# Patient Record
Sex: Male | Born: 1960 | Race: Black or African American | Hispanic: No | Marital: Married | Smoking: Current every day smoker
Health system: Southern US, Community
[De-identification: ages and names within clinical notes are randomized; demographics above are authoritative.]

## PROBLEM LIST (undated history)

## (undated) DIAGNOSIS — S0990XA Unspecified injury of head, initial encounter: Secondary | ICD-10-CM

## (undated) DIAGNOSIS — J449 Chronic obstructive pulmonary disease, unspecified: Secondary | ICD-10-CM

## (undated) DIAGNOSIS — K5732 Diverticulitis of large intestine without perforation or abscess without bleeding: Secondary | ICD-10-CM

## (undated) DIAGNOSIS — M479 Spondylosis, unspecified: Secondary | ICD-10-CM

## (undated) DIAGNOSIS — I10 Essential (primary) hypertension: Secondary | ICD-10-CM

## (undated) HISTORY — PX: APPENDECTOMY: SHX54

## (undated) HISTORY — PX: CHOLECYSTECTOMY: SHX55

## (undated) HISTORY — PX: COLOSTOMY TAKEDOWN: SHX5258

---

## 2006-12-01 ENCOUNTER — Ambulatory Visit: Payer: Self-pay | Admitting: Family Medicine

## 2006-12-05 ENCOUNTER — Ambulatory Visit: Payer: Self-pay | Admitting: *Deleted

## 2007-01-12 ENCOUNTER — Ambulatory Visit: Payer: Self-pay | Admitting: Family Medicine

## 2007-01-28 ENCOUNTER — Emergency Department (HOSPITAL_COMMUNITY): Admission: EM | Admit: 2007-01-28 | Discharge: 2007-01-28 | Payer: Self-pay | Admitting: Emergency Medicine

## 2007-01-29 ENCOUNTER — Encounter: Payer: Self-pay | Admitting: Vascular Surgery

## 2007-04-17 ENCOUNTER — Emergency Department (HOSPITAL_COMMUNITY): Admission: EM | Admit: 2007-04-17 | Discharge: 2007-04-17 | Payer: Self-pay | Admitting: Emergency Medicine

## 2007-06-24 ENCOUNTER — Encounter (INDEPENDENT_AMBULATORY_CARE_PROVIDER_SITE_OTHER): Payer: Self-pay | Admitting: *Deleted

## 2007-10-08 HISTORY — PX: COLOSTOMY: SHX63

## 2007-10-08 HISTORY — PX: LEFT COLECTOMY: SHX856

## 2007-10-08 HISTORY — PX: HERNIA REPAIR: SHX51

## 2007-11-02 ENCOUNTER — Ambulatory Visit: Payer: Self-pay | Admitting: Internal Medicine

## 2008-06-27 ENCOUNTER — Emergency Department (HOSPITAL_COMMUNITY): Admission: EM | Admit: 2008-06-27 | Discharge: 2008-06-27 | Payer: Self-pay | Admitting: Emergency Medicine

## 2009-02-26 ENCOUNTER — Emergency Department (HOSPITAL_COMMUNITY): Admission: EM | Admit: 2009-02-26 | Discharge: 2009-02-26 | Payer: Self-pay | Admitting: Emergency Medicine

## 2009-11-25 ENCOUNTER — Emergency Department (HOSPITAL_COMMUNITY): Admission: EM | Admit: 2009-11-25 | Discharge: 2009-11-25 | Payer: Self-pay | Admitting: Emergency Medicine

## 2010-09-24 ENCOUNTER — Emergency Department (HOSPITAL_COMMUNITY)
Admission: EM | Admit: 2010-09-24 | Discharge: 2010-09-24 | Payer: Self-pay | Source: Home / Self Care | Admitting: Emergency Medicine

## 2011-02-28 ENCOUNTER — Emergency Department (HOSPITAL_COMMUNITY)
Admission: EM | Admit: 2011-02-28 | Discharge: 2011-03-01 | Disposition: A | Discharge status: Home / Self Care | Payer: Medicaid Other | Attending: Emergency Medicine | Admitting: Emergency Medicine

## 2011-02-28 ENCOUNTER — Emergency Department (HOSPITAL_COMMUNITY): Payer: Medicaid Other

## 2011-02-28 DIAGNOSIS — R079 Chest pain, unspecified: Secondary | ICD-10-CM | POA: Insufficient documentation

## 2011-02-28 DIAGNOSIS — F341 Dysthymic disorder: Secondary | ICD-10-CM | POA: Insufficient documentation

## 2011-02-28 DIAGNOSIS — J449 Chronic obstructive pulmonary disease, unspecified: Secondary | ICD-10-CM | POA: Insufficient documentation

## 2011-02-28 DIAGNOSIS — R0989 Other specified symptoms and signs involving the circulatory and respiratory systems: Secondary | ICD-10-CM | POA: Insufficient documentation

## 2011-02-28 DIAGNOSIS — R059 Cough, unspecified: Secondary | ICD-10-CM | POA: Insufficient documentation

## 2011-02-28 DIAGNOSIS — R0609 Other forms of dyspnea: Secondary | ICD-10-CM | POA: Insufficient documentation

## 2011-02-28 DIAGNOSIS — R05 Cough: Secondary | ICD-10-CM | POA: Insufficient documentation

## 2011-02-28 DIAGNOSIS — J4489 Other specified chronic obstructive pulmonary disease: Secondary | ICD-10-CM | POA: Insufficient documentation

## 2011-02-28 DIAGNOSIS — I10 Essential (primary) hypertension: Secondary | ICD-10-CM | POA: Insufficient documentation

## 2011-03-01 ENCOUNTER — Emergency Department (HOSPITAL_COMMUNITY): Payer: Medicaid Other

## 2011-03-01 ENCOUNTER — Encounter (HOSPITAL_COMMUNITY): Payer: Self-pay

## 2011-03-01 LAB — DIFFERENTIAL
Basophils Relative: 0 % (ref 0–1)
Eosinophils Absolute: 0.1 10*3/uL (ref 0.0–0.7)
Lymphocytes Relative: 17 % (ref 12–46)
Lymphs Abs: 1.6 10*3/uL (ref 0.7–4.0)
Monocytes Absolute: 0.8 10*3/uL (ref 0.1–1.0)
Monocytes Relative: 9 % (ref 3–12)

## 2011-03-01 LAB — CBC
Hemoglobin: 17 g/dL (ref 13.0–17.0)
MCHC: 34.6 g/dL (ref 30.0–36.0)
RDW: 13 % (ref 11.5–15.5)

## 2011-03-01 LAB — POCT I-STAT, CHEM 8
BUN: 12 mg/dL (ref 6–23)
Calcium, Ion: 1.1 mmol/L — ABNORMAL LOW (ref 1.12–1.32)
Chloride: 106 mEq/L (ref 96–112)
Creatinine, Ser: 0.9 mg/dL (ref 0.4–1.5)
HCT: 53 % — ABNORMAL HIGH (ref 39.0–52.0)
Hemoglobin: 18 g/dL — ABNORMAL HIGH (ref 13.0–17.0)
Potassium: 3.9 mEq/L (ref 3.5–5.1)
TCO2: 21 mmol/L (ref 0–100)

## 2011-03-01 LAB — CK TOTAL AND CKMB (NOT AT ARMC): CK, MB: 1.2 ng/mL (ref 0.3–4.0)

## 2011-03-01 LAB — TROPONIN I: Troponin I: <0.3 ng/mL (ref <0.30)

## 2011-03-01 MED ORDER — IOHEXOL 300 MG/ML  SOLN
100.0000 mL | Freq: Once | INTRAMUSCULAR | Status: AC | PRN
  Administered 2011-03-01: 100 mL via INTRAVENOUS

## 2011-05-31 ENCOUNTER — Emergency Department (HOSPITAL_COMMUNITY): Payer: Medicaid Other

## 2011-05-31 ENCOUNTER — Emergency Department (HOSPITAL_COMMUNITY)
Admission: EM | Admit: 2011-05-31 | Discharge: 2011-05-31 | Disposition: A | Discharge status: Home / Self Care | Payer: Medicaid Other | Attending: Emergency Medicine | Admitting: Emergency Medicine

## 2011-05-31 DIAGNOSIS — M79609 Pain in unspecified limb: Secondary | ICD-10-CM | POA: Insufficient documentation

## 2011-05-31 DIAGNOSIS — M542 Cervicalgia: Secondary | ICD-10-CM | POA: Insufficient documentation

## 2011-05-31 DIAGNOSIS — M25519 Pain in unspecified shoulder: Secondary | ICD-10-CM | POA: Insufficient documentation

## 2011-05-31 DIAGNOSIS — F341 Dysthymic disorder: Secondary | ICD-10-CM | POA: Insufficient documentation

## 2011-05-31 DIAGNOSIS — I10 Essential (primary) hypertension: Secondary | ICD-10-CM | POA: Insufficient documentation

## 2011-05-31 DIAGNOSIS — M62838 Other muscle spasm: Secondary | ICD-10-CM | POA: Insufficient documentation

## 2012-01-25 ENCOUNTER — Emergency Department (HOSPITAL_COMMUNITY)
Admission: EM | Admit: 2012-01-25 | Discharge: 2012-01-25 | Disposition: A | Discharge status: Home / Self Care | Payer: Medicaid Other | Attending: Emergency Medicine | Admitting: Emergency Medicine

## 2012-01-25 ENCOUNTER — Encounter (HOSPITAL_COMMUNITY): Payer: Self-pay | Admitting: *Deleted

## 2012-01-25 DIAGNOSIS — Z933 Colostomy status: Secondary | ICD-10-CM | POA: Insufficient documentation

## 2012-01-25 DIAGNOSIS — M5430 Sciatica, unspecified side: Secondary | ICD-10-CM | POA: Insufficient documentation

## 2012-01-25 DIAGNOSIS — W010XXA Fall on same level from slipping, tripping and stumbling without subsequent striking against object, initial encounter: Secondary | ICD-10-CM | POA: Insufficient documentation

## 2012-01-25 DIAGNOSIS — M479 Spondylosis, unspecified: Secondary | ICD-10-CM | POA: Insufficient documentation

## 2012-01-25 DIAGNOSIS — F17200 Nicotine dependence, unspecified, uncomplicated: Secondary | ICD-10-CM | POA: Insufficient documentation

## 2012-01-25 HISTORY — DX: Spondylosis, unspecified: M47.9

## 2012-01-25 MED ORDER — KETOROLAC TROMETHAMINE 60 MG/2ML IM SOLN
60.0000 mg | Freq: Once | INTRAMUSCULAR | Status: AC
  Administered 2012-01-25: 60 mg via INTRAMUSCULAR
  Filled 2012-01-25: qty 2

## 2012-01-25 MED ORDER — CYCLOBENZAPRINE HCL 10 MG PO TABS: 10.0000 mg | ORAL_TABLET | Freq: Three times a day (TID) | ORAL | Status: AC | PRN

## 2012-01-25 MED ORDER — PREDNISONE 50 MG PO TABS: 50.0000 mg | ORAL_TABLET | Freq: Every day | ORAL | Status: AC

## 2012-01-25 MED ORDER — OXYCODONE-ACETAMINOPHEN 5-325 MG PO TABS: 1.0000 | ORAL_TABLET | Freq: Four times a day (QID) | ORAL | Status: AC | PRN

## 2012-01-25 MED ORDER — HYDROCODONE-ACETAMINOPHEN 5-325 MG PO TABS: 1.0000 | ORAL_TABLET | Freq: Four times a day (QID) | ORAL | Status: DC | PRN

## 2012-01-25 NOTE — ED Notes (Addendum)
Pt c/o lower back pain that began yesterday. Describes pain as throbbing. States he was diagnosed with scoliosis and degenerative arthritis in lower spine 1 year ago

## 2012-01-25 NOTE — Discharge Instructions (Signed)
Ice and heat to your lower back. Return here as needed. Follow up with your doctor for a recheck.
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
## 2012-01-27 NOTE — ED Provider Notes (Signed)
Medical screening examination/treatment/procedure(s) were performed by non-physician practitioner and as supervising physician I was immediately available for consultation/collaboration.    Vernon Ariel L Naseem Varden, MD 01/27/12 1104 

## 2012-04-04 ENCOUNTER — Emergency Department (HOSPITAL_COMMUNITY): Payer: No Typology Code available for payment source

## 2012-04-04 ENCOUNTER — Encounter (HOSPITAL_COMMUNITY): Payer: Self-pay | Admitting: *Deleted

## 2012-04-04 ENCOUNTER — Emergency Department (HOSPITAL_COMMUNITY)
Admission: EM | Admit: 2012-04-04 | Discharge: 2012-04-05 | Disposition: A | Discharge status: Home / Self Care | Payer: No Typology Code available for payment source | Attending: Emergency Medicine | Admitting: Emergency Medicine

## 2012-04-04 DIAGNOSIS — T1490XA Injury, unspecified, initial encounter: Secondary | ICD-10-CM | POA: Insufficient documentation

## 2012-04-04 DIAGNOSIS — I10 Essential (primary) hypertension: Secondary | ICD-10-CM | POA: Insufficient documentation

## 2012-04-04 DIAGNOSIS — M542 Cervicalgia: Secondary | ICD-10-CM | POA: Insufficient documentation

## 2012-04-04 DIAGNOSIS — R51 Headache: Secondary | ICD-10-CM | POA: Insufficient documentation

## 2012-04-04 DIAGNOSIS — S0180XA Unspecified open wound of other part of head, initial encounter: Secondary | ICD-10-CM | POA: Insufficient documentation

## 2012-04-04 DIAGNOSIS — R079 Chest pain, unspecified: Secondary | ICD-10-CM | POA: Insufficient documentation

## 2012-04-04 DIAGNOSIS — R0602 Shortness of breath: Secondary | ICD-10-CM | POA: Insufficient documentation

## 2012-04-04 HISTORY — DX: Essential (primary) hypertension: I10

## 2012-04-04 MED ORDER — FENTANYL CITRATE 0.05 MG/ML IJ SOLN
50.0000 ug | Freq: Once | INTRAMUSCULAR | Status: AC
  Administered 2012-04-04: 50 ug via INTRAVENOUS
  Filled 2012-04-04: qty 2

## 2012-04-04 MED ORDER — TETANUS-DIPHTH-ACELL PERTUSSIS 5-2.5-18.5 LF-MCG/0.5 IM SUSP
0.5000 mL | Freq: Once | INTRAMUSCULAR | Status: AC
  Administered 2012-04-04: 0.5 mL via INTRAMUSCULAR
  Filled 2012-04-04: qty 0.5
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
## 2012-04-05 MED ORDER — HYDROCODONE-ACETAMINOPHEN 5-500 MG PO TABS: 1.0000 | ORAL_TABLET | Freq: Four times a day (QID) | ORAL | Status: DC | PRN

## 2012-04-05 MED ORDER — HYDROCODONE-ACETAMINOPHEN 5-325 MG PO TABS
1.0000 | ORAL_TABLET | Freq: Once | ORAL | Status: AC
  Administered 2012-04-05: 1 via ORAL
  Filled 2012-04-05: qty 1

## 2012-04-05 MED ORDER — HYDROMORPHONE HCL PF 1 MG/ML IJ SOLN
1.0000 mg | Freq: Once | INTRAMUSCULAR | Status: AC
  Administered 2012-04-05: 1 mg via INTRAVENOUS

## 2012-04-05 MED ORDER — HYDROMORPHONE HCL PF 1 MG/ML IJ SOLN
INTRAMUSCULAR | Status: AC
  Filled 2012-04-05: qty 1
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
## 2012-04-05 NOTE — ED Notes (Signed)
Wounds cleaned and dressed.

## 2012-04-09 ENCOUNTER — Encounter (HOSPITAL_COMMUNITY): Payer: Self-pay | Admitting: Emergency Medicine

## 2012-04-09 ENCOUNTER — Emergency Department (HOSPITAL_COMMUNITY)
Admission: EM | Admit: 2012-04-09 | Discharge: 2012-04-09 | Disposition: A | Discharge status: Home / Self Care | Payer: No Typology Code available for payment source | Attending: Emergency Medicine | Admitting: Emergency Medicine

## 2012-04-09 DIAGNOSIS — F17200 Nicotine dependence, unspecified, uncomplicated: Secondary | ICD-10-CM | POA: Insufficient documentation

## 2012-04-09 DIAGNOSIS — IMO0002 Reserved for concepts with insufficient information to code with codable children: Secondary | ICD-10-CM

## 2012-04-09 DIAGNOSIS — I10 Essential (primary) hypertension: Secondary | ICD-10-CM | POA: Insufficient documentation

## 2012-04-09 DIAGNOSIS — IMO0001 Reserved for inherently not codable concepts without codable children: Secondary | ICD-10-CM

## 2012-04-09 DIAGNOSIS — Z4802 Encounter for removal of sutures: Secondary | ICD-10-CM | POA: Insufficient documentation

## 2012-04-09 MED ORDER — HYDROCODONE-ACETAMINOPHEN 5-500 MG PO TABS: 1.0000 | ORAL_TABLET | Freq: Four times a day (QID) | ORAL | Status: AC | PRN

## 2012-04-09 MED ORDER — HYDROCODONE-ACETAMINOPHEN 5-325 MG PO TABS
2.0000 | ORAL_TABLET | Freq: Once | ORAL | Status: AC
  Administered 2012-04-09: 2 via ORAL
  Filled 2012-04-09: qty 2
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
## 2012-04-09 NOTE — ED Notes (Signed)
Pt has multiple abrasions on extremeties and face. Laceration over l/eye. Sutures in place

## 2012-04-16 ENCOUNTER — Encounter (HOSPITAL_BASED_OUTPATIENT_CLINIC_OR_DEPARTMENT_OTHER): Payer: No Typology Code available for payment source | Attending: Internal Medicine

## 2012-04-16 DIAGNOSIS — I10 Essential (primary) hypertension: Secondary | ICD-10-CM | POA: Insufficient documentation

## 2012-04-16 DIAGNOSIS — S81009A Unspecified open wound, unspecified knee, initial encounter: Secondary | ICD-10-CM | POA: Insufficient documentation

## 2012-04-16 DIAGNOSIS — S81809A Unspecified open wound, unspecified lower leg, initial encounter: Secondary | ICD-10-CM | POA: Insufficient documentation

## 2012-04-16 DIAGNOSIS — M1990 Unspecified osteoarthritis, unspecified site: Secondary | ICD-10-CM | POA: Insufficient documentation

## 2012-04-16 DIAGNOSIS — S61509A Unspecified open wound of unspecified wrist, initial encounter: Secondary | ICD-10-CM | POA: Insufficient documentation
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
## 2012-04-23 ENCOUNTER — Emergency Department (HOSPITAL_COMMUNITY)
Admission: EM | Admit: 2012-04-23 | Discharge: 2012-04-23 | Disposition: A | Discharge status: Home / Self Care | Payer: No Typology Code available for payment source | Attending: Emergency Medicine | Admitting: Emergency Medicine

## 2012-04-23 ENCOUNTER — Encounter (HOSPITAL_COMMUNITY): Payer: Self-pay | Admitting: Emergency Medicine

## 2012-04-23 DIAGNOSIS — Z833 Family history of diabetes mellitus: Secondary | ICD-10-CM | POA: Insufficient documentation

## 2012-04-23 DIAGNOSIS — S060X0A Concussion without loss of consciousness, initial encounter: Secondary | ICD-10-CM | POA: Insufficient documentation

## 2012-04-23 DIAGNOSIS — F17200 Nicotine dependence, unspecified, uncomplicated: Secondary | ICD-10-CM | POA: Insufficient documentation

## 2012-04-23 DIAGNOSIS — Z760 Encounter for issue of repeat prescription: Secondary | ICD-10-CM

## 2012-04-23 DIAGNOSIS — M479 Spondylosis, unspecified: Secondary | ICD-10-CM | POA: Insufficient documentation

## 2012-04-23 DIAGNOSIS — Z9089 Acquired absence of other organs: Secondary | ICD-10-CM | POA: Insufficient documentation

## 2012-04-23 DIAGNOSIS — S060X9A Concussion with loss of consciousness of unspecified duration, initial encounter: Secondary | ICD-10-CM

## 2012-04-23 DIAGNOSIS — I10 Essential (primary) hypertension: Secondary | ICD-10-CM | POA: Insufficient documentation

## 2012-04-23 MED ORDER — IBUPROFEN 200 MG PO TABS
600.0000 mg | ORAL_TABLET | Freq: Once | ORAL | Status: AC
  Administered 2012-04-23: 600 mg via ORAL
  Filled 2012-04-23: qty 3

## 2012-04-23 MED ORDER — OXYCODONE-ACETAMINOPHEN 5-325 MG PO TABS
2.0000 | ORAL_TABLET | Freq: Once | ORAL | Status: AC
  Administered 2012-04-23: 2 via ORAL
  Filled 2012-04-23: qty 2

## 2012-04-23 MED ORDER — HYDROCODONE-ACETAMINOPHEN 5-325 MG PO TABS: 1.0000 | ORAL_TABLET | Freq: Four times a day (QID) | ORAL | Status: AC | PRN

## 2012-04-23 MED ORDER — ALPRAZOLAM 1 MG PO TABS: 1.0000 mg | ORAL_TABLET | Freq: Three times a day (TID) | ORAL | Status: DC | PRN
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
## 2012-05-14 ENCOUNTER — Encounter (HOSPITAL_BASED_OUTPATIENT_CLINIC_OR_DEPARTMENT_OTHER): Payer: No Typology Code available for payment source | Attending: Internal Medicine

## 2012-05-14 DIAGNOSIS — S81009A Unspecified open wound, unspecified knee, initial encounter: Secondary | ICD-10-CM | POA: Insufficient documentation

## 2012-05-14 DIAGNOSIS — S91009A Unspecified open wound, unspecified ankle, initial encounter: Secondary | ICD-10-CM | POA: Insufficient documentation

## 2012-05-14 DIAGNOSIS — I10 Essential (primary) hypertension: Secondary | ICD-10-CM | POA: Insufficient documentation

## 2012-05-14 DIAGNOSIS — M1990 Unspecified osteoarthritis, unspecified site: Secondary | ICD-10-CM | POA: Insufficient documentation

## 2012-05-14 DIAGNOSIS — S61509A Unspecified open wound of unspecified wrist, initial encounter: Secondary | ICD-10-CM | POA: Insufficient documentation

## 2012-05-28 ENCOUNTER — Emergency Department (HOSPITAL_COMMUNITY)
Admission: EM | Admit: 2012-05-28 | Discharge: 2012-05-29 | Disposition: A | Discharge status: Home / Self Care | Payer: Medicaid Other | Attending: Emergency Medicine | Admitting: Emergency Medicine

## 2012-05-28 ENCOUNTER — Encounter (HOSPITAL_COMMUNITY): Payer: Self-pay | Admitting: Emergency Medicine

## 2012-05-28 DIAGNOSIS — Z79899 Other long term (current) drug therapy: Secondary | ICD-10-CM | POA: Insufficient documentation

## 2012-05-28 DIAGNOSIS — S30860A Insect bite (nonvenomous) of lower back and pelvis, initial encounter: Secondary | ICD-10-CM | POA: Insufficient documentation

## 2012-05-28 DIAGNOSIS — W57XXXA Bitten or stung by nonvenomous insect and other nonvenomous arthropods, initial encounter: Secondary | ICD-10-CM | POA: Insufficient documentation

## 2012-05-28 DIAGNOSIS — L0390 Cellulitis, unspecified: Secondary | ICD-10-CM

## 2012-05-28 DIAGNOSIS — G43909 Migraine, unspecified, not intractable, without status migrainosus: Secondary | ICD-10-CM | POA: Insufficient documentation

## 2012-05-28 DIAGNOSIS — L02219 Cutaneous abscess of trunk, unspecified: Secondary | ICD-10-CM | POA: Insufficient documentation

## 2012-05-28 DIAGNOSIS — I10 Essential (primary) hypertension: Secondary | ICD-10-CM | POA: Insufficient documentation

## 2012-05-28 HISTORY — DX: Unspecified injury of head, initial encounter: S09.90XA

## 2012-05-28 MED ORDER — DEXAMETHASONE SODIUM PHOSPHATE 10 MG/ML IJ SOLN
10.0000 mg | Freq: Once | INTRAMUSCULAR | Status: AC
  Administered 2012-05-29: 10 mg via INTRAMUSCULAR
  Filled 2012-05-28: qty 1

## 2012-05-28 MED ORDER — ONDANSETRON 4 MG PO TBDP
4.0000 mg | ORAL_TABLET | Freq: Once | ORAL | Status: AC
  Administered 2012-05-28: 4 mg via ORAL
  Filled 2012-05-28: qty 1

## 2012-05-28 MED ORDER — SULFAMETHOXAZOLE-TMP DS 800-160 MG PO TABS
1.0000 | ORAL_TABLET | Freq: Once | ORAL | Status: AC
  Administered 2012-05-28: 1 via ORAL
  Filled 2012-05-28: qty 1

## 2012-05-28 MED ORDER — CIPROFLOXACIN HCL 500 MG PO TABS: 500.0000 mg | ORAL_TABLET | Freq: Once | ORAL | Status: DC

## 2012-05-28 MED ORDER — CEPHALEXIN 250 MG PO CAPS
250.0000 mg | ORAL_CAPSULE | Freq: Once | ORAL | Status: AC
  Administered 2012-05-29: 250 mg via ORAL
  Filled 2012-05-28: qty 1

## 2012-05-28 MED ORDER — KETOROLAC TROMETHAMINE 60 MG/2ML IM SOLN
60.0000 mg | Freq: Once | INTRAMUSCULAR | Status: AC
  Administered 2012-05-29: 60 mg via INTRAMUSCULAR
  Filled 2012-05-28: qty 2

## 2012-05-28 MED ORDER — HYDROMORPHONE HCL PF 1 MG/ML IJ SOLN
1.0000 mg | Freq: Once | INTRAMUSCULAR | Status: AC
  Administered 2012-05-29: 1 mg via INTRAMUSCULAR
  Filled 2012-05-28: qty 1
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
## 2012-05-28 NOTE — ED Notes (Signed)
Pt states he is here mainly for his migraine headache  Pt states has hx of same   Pt states earlier today he was stung by a bee on his left side  Pt has a large welp noted with redness  Pt states he has had some dizziness today too but is not sure if it is from the headache or the sting

## 2012-05-29 MED ORDER — OXYCODONE-ACETAMINOPHEN 5-325 MG PO TABS: 1.0000 | ORAL_TABLET | Freq: Four times a day (QID) | ORAL | Status: AC | PRN

## 2012-05-29 MED ORDER — ONDANSETRON HCL 4 MG PO TABS: 4.0000 mg | ORAL_TABLET | Freq: Four times a day (QID) | ORAL | Status: AC

## 2012-05-29 MED ORDER — SULFAMETHOXAZOLE-TRIMETHOPRIM 800-160 MG PO TABS: 1.0000 | ORAL_TABLET | Freq: Two times a day (BID) | ORAL | Status: AC

## 2012-05-29 MED ORDER — CEPHALEXIN 500 MG PO CAPS: 500.0000 mg | ORAL_CAPSULE | Freq: Four times a day (QID) | ORAL | Status: AC

## 2012-05-29 MED ORDER — OXYCODONE-ACETAMINOPHEN 5-325 MG PO TABS
2.0000 | ORAL_TABLET | Freq: Once | ORAL | Status: AC
  Administered 2012-05-29: 2 via ORAL
  Filled 2012-05-29: qty 2

## 2012-05-30 NOTE — ED Provider Notes (Signed)
Medical screening examination/treatment/procedure(s) were performed by non-physician practitioner and as supervising physician I was immediately available for consultation/collaboration.   Charleton Deyoung, MD 05/30/12 2304 

## 2012-08-03 ENCOUNTER — Emergency Department (HOSPITAL_COMMUNITY)
Admission: EM | Admit: 2012-08-03 | Discharge: 2012-08-03 | Disposition: A | Discharge status: Home / Self Care | Payer: Medicaid Other | Attending: Dermatology | Admitting: Dermatology

## 2012-08-03 ENCOUNTER — Encounter (HOSPITAL_COMMUNITY): Payer: Self-pay | Admitting: *Deleted

## 2012-08-03 ENCOUNTER — Emergency Department (HOSPITAL_COMMUNITY): Payer: Medicaid Other

## 2012-08-03 DIAGNOSIS — Z87828 Personal history of other (healed) physical injury and trauma: Secondary | ICD-10-CM | POA: Insufficient documentation

## 2012-08-03 DIAGNOSIS — R0609 Other forms of dyspnea: Secondary | ICD-10-CM | POA: Insufficient documentation

## 2012-08-03 DIAGNOSIS — R0989 Other specified symptoms and signs involving the circulatory and respiratory systems: Secondary | ICD-10-CM | POA: Insufficient documentation

## 2012-08-03 DIAGNOSIS — R51 Headache: Secondary | ICD-10-CM | POA: Insufficient documentation

## 2012-08-03 DIAGNOSIS — F17200 Nicotine dependence, unspecified, uncomplicated: Secondary | ICD-10-CM | POA: Insufficient documentation

## 2012-08-03 DIAGNOSIS — R0600 Dyspnea, unspecified: Secondary | ICD-10-CM

## 2012-08-03 DIAGNOSIS — Z79899 Other long term (current) drug therapy: Secondary | ICD-10-CM | POA: Insufficient documentation

## 2012-08-03 DIAGNOSIS — M479 Spondylosis, unspecified: Secondary | ICD-10-CM | POA: Insufficient documentation

## 2012-08-03 DIAGNOSIS — I10 Essential (primary) hypertension: Secondary | ICD-10-CM | POA: Insufficient documentation

## 2012-08-03 HISTORY — DX: Chronic obstructive pulmonary disease, unspecified: J44.9

## 2012-08-03 LAB — CBC
HCT: 51 % (ref 39.0–52.0)
Hemoglobin: 18.4 g/dL — ABNORMAL HIGH (ref 13.0–17.0)
MCV: 91.1 fL (ref 78.0–100.0)
RDW: 13.8 % (ref 11.5–15.5)
WBC: 4.3 10*3/uL (ref 4.0–10.5)

## 2012-08-03 LAB — BASIC METABOLIC PANEL
BUN: 11 mg/dL (ref 6–23)
Chloride: 103 mEq/L (ref 96–112)
Creatinine, Ser: 0.81 mg/dL (ref 0.50–1.35)
Glucose, Bld: 105 mg/dL — ABNORMAL HIGH (ref 70–99)
Potassium: 4.2 mEq/L (ref 3.5–5.1)

## 2012-08-03 MED ORDER — HYDROCODONE-ACETAMINOPHEN 5-325 MG PO TABS
2.0000 | ORAL_TABLET | Freq: Once | ORAL | Status: AC
  Administered 2012-08-03: 2 via ORAL
  Filled 2012-08-03: qty 2

## 2012-08-03 MED ORDER — HYDROCODONE-ACETAMINOPHEN 5-500 MG PO TABS: 1.0000 | ORAL_TABLET | Freq: Four times a day (QID) | ORAL | Status: DC | PRN

## 2012-08-03 MED ORDER — PROMETHAZINE HCL 25 MG/ML IJ SOLN
25.0000 mg | Freq: Once | INTRAMUSCULAR | Status: AC
  Administered 2012-08-03: 25 mg via INTRAMUSCULAR
  Filled 2012-08-03: qty 1

## 2012-08-03 MED ORDER — ALBUTEROL SULFATE HFA 108 (90 BASE) MCG/ACT IN AERS: 2.0000 | INHALATION_SPRAY | RESPIRATORY_TRACT | Status: DC | PRN

## 2012-08-03 MED ORDER — ALBUTEROL SULFATE (5 MG/ML) 0.5% IN NEBU
5.0000 mg | INHALATION_SOLUTION | Freq: Once | RESPIRATORY_TRACT | Status: AC
  Administered 2012-08-03: 5 mg via RESPIRATORY_TRACT
  Filled 2012-08-03: qty 1

## 2012-08-03 NOTE — ED Notes (Signed)
Off floor for testing 

## 2012-08-03 NOTE — ED Notes (Addendum)
Pt's family came out of the room, stating "he's getting ready to leave.  He's been here for 6 hours now and nothing is done."  She started to walk away from the room.  Pt's other family/friend came out of the room stating pt is needing to use the BR.  Went in the room, pt standing beside the stretcher with gown on the stretcher.  Pt states that he's been waiting for a long time.  He was given pain med at 1400 and it has helped for "a little bit".  Pt ambulated to the BR, pt made aware that while he is using the BR that this nurse will talk with EDP who was f/u with him re pain med and what his dispo is.  [facility] ED[facility] notified.  Went back in room to speak to pt.  Pt asked "what are these anyways" pt pointing at the cardiac monitor and the leads.  Asked pt if he was c/o chest pain.  Pt states "yes."  Explained to pt the reason for the cardiac monitor and also the reason for more lab being drawn at certain times for comparison.  At this time Stacy primary nurse came in the room.  Pt also made aware that [facility] EDP put in an order for phernergan for his h/a, which is coming from the pharmacy.

## 2012-08-03 NOTE — ED Notes (Signed)
RT called

## 2012-08-03 NOTE — ED Notes (Signed)
Per patient/family member, patient has a history of COPD

## 2012-08-03 NOTE — ED Notes (Addendum)
Pt reports being sent to radiology for chest xray for shob by PCP. Decided to come to ED due to migraines he has been having. Is being followed by a neurologist. Sts he is taking topamax but it is making him "sick" and the office has not called him back. Pt reports migraine pain radiates down neck and back, and dizziness. Also wants to be seen for the shob, none noted currently, pt in NAD. Able to easily speak in complete sentences.
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
## 2013-03-29 ENCOUNTER — Emergency Department (HOSPITAL_COMMUNITY)
Admission: EM | Admit: 2013-03-29 | Discharge: 2013-03-29 | Disposition: A | Discharge status: Home / Self Care | Payer: Self-pay | Attending: Emergency Medicine | Admitting: Emergency Medicine

## 2013-03-29 ENCOUNTER — Encounter (HOSPITAL_COMMUNITY): Payer: Self-pay | Admitting: Emergency Medicine

## 2013-03-29 ENCOUNTER — Emergency Department (HOSPITAL_COMMUNITY): Payer: Self-pay

## 2013-03-29 DIAGNOSIS — M479 Spondylosis, unspecified: Secondary | ICD-10-CM | POA: Insufficient documentation

## 2013-03-29 DIAGNOSIS — J4489 Other specified chronic obstructive pulmonary disease: Secondary | ICD-10-CM | POA: Insufficient documentation

## 2013-03-29 DIAGNOSIS — I10 Essential (primary) hypertension: Secondary | ICD-10-CM | POA: Insufficient documentation

## 2013-03-29 DIAGNOSIS — J449 Chronic obstructive pulmonary disease, unspecified: Secondary | ICD-10-CM | POA: Insufficient documentation

## 2013-03-29 DIAGNOSIS — M25512 Pain in left shoulder: Secondary | ICD-10-CM

## 2013-03-29 DIAGNOSIS — K047 Periapical abscess without sinus: Secondary | ICD-10-CM

## 2013-03-29 DIAGNOSIS — Z9119 Patient's noncompliance with other medical treatment and regimen: Secondary | ICD-10-CM | POA: Insufficient documentation

## 2013-03-29 DIAGNOSIS — M25519 Pain in unspecified shoulder: Secondary | ICD-10-CM | POA: Insufficient documentation

## 2013-03-29 DIAGNOSIS — Z79899 Other long term (current) drug therapy: Secondary | ICD-10-CM | POA: Insufficient documentation

## 2013-03-29 DIAGNOSIS — F17200 Nicotine dependence, unspecified, uncomplicated: Secondary | ICD-10-CM | POA: Insufficient documentation

## 2013-03-29 DIAGNOSIS — Z91199 Patient's noncompliance with other medical treatment and regimen due to unspecified reason: Secondary | ICD-10-CM | POA: Insufficient documentation

## 2013-03-29 MED ORDER — HYDROCHLOROTHIAZIDE 12.5 MG PO CAPS
12.5000 mg | ORAL_CAPSULE | Freq: Once | ORAL | Status: AC
  Administered 2013-03-29: 12.5 mg via ORAL
  Filled 2013-03-29: qty 1

## 2013-03-29 MED ORDER — LISINOPRIL 20 MG PO TABS
20.0000 mg | ORAL_TABLET | Freq: Every day | ORAL | Status: DC
  Administered 2013-03-29: 20 mg via ORAL
  Filled 2013-03-29: qty 1

## 2013-03-29 MED ORDER — OXYCODONE-ACETAMINOPHEN 5-325 MG PO TABS
2.0000 | ORAL_TABLET | Freq: Once | ORAL | Status: AC
  Administered 2013-03-29: 2 via ORAL
  Filled 2013-03-29: qty 2

## 2013-03-29 MED ORDER — TOPIRAMATE 50 MG PO TABS: 50.0000 mg | ORAL_TABLET | Freq: Two times a day (BID) | ORAL | Status: DC

## 2013-03-29 MED ORDER — ALBUTEROL SULFATE HFA 108 (90 BASE) MCG/ACT IN AERS: 2.0000 | INHALATION_SPRAY | RESPIRATORY_TRACT | Status: AC | PRN

## 2013-03-29 MED ORDER — DEXAMETHASONE SODIUM PHOSPHATE 10 MG/ML IJ SOLN
10.0000 mg | Freq: Once | INTRAMUSCULAR | Status: AC
  Administered 2013-03-29: 10 mg via INTRAMUSCULAR
  Filled 2013-03-29: qty 1

## 2013-03-29 MED ORDER — LISINOPRIL-HYDROCHLOROTHIAZIDE 20-12.5 MG PO TABS: 1.0000 | ORAL_TABLET | Freq: Every day | ORAL | Status: DC

## 2013-03-29 MED ORDER — OXYCODONE-ACETAMINOPHEN 10-325 MG PO TABS: 1.0000 | ORAL_TABLET | ORAL | Status: DC | PRN

## 2013-03-29 NOTE — ED Notes (Signed)
Pt reports having hx of HTN-ran out of his meds since Friday last week.

## 2013-03-29 NOTE — ED Notes (Signed)
Pt presenting to ed with c/o sore throat pain x 1 1/2 days and left shoulder pain pt denies injury at this time

## 2013-03-29 NOTE — ED Provider Notes (Signed)
4:10 PM I saw the patient earlier and did not discharge him with an antibiotic for dental infection. I was able to contact the patient and called in a prescription for amoxicillin 500 mg bid x 7 days at [facility].  Arthor Captain, PA-C 03/29/13 1611
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
## 2013-03-29 NOTE — Progress Notes (Signed)
[facility] has seen patient. Pt stated that he has medicaid, but something was wrong with card at Registration. Pt stated he was going to call about it when he leaves the hospital. Pt stated he was pending disability. Provided pt with oc application and a list of primary care resources.

## 2013-03-30 NOTE — ED Provider Notes (Signed)
Medical screening examination/treatment/procedure(s) were performed by non-physician practitioner and as supervising physician I was immediately available for consultation/collaboration.  Mahala Rommel M Blyss Lugar, MD 03/30/13 0710 

## 2013-03-30 NOTE — ED Provider Notes (Signed)
Medical screening examination/treatment/procedure(s) were performed by non-physician practitioner and as supervising physician I was immediately available for consultation/collaboration.  Lyanne Co, MD 03/30/13 [phone]

## 2013-03-31 LAB — CULTURE, GROUP A STREP

## 2013-07-23 ENCOUNTER — Emergency Department (HOSPITAL_COMMUNITY): Payer: Medicaid Other

## 2013-07-23 ENCOUNTER — Emergency Department (HOSPITAL_COMMUNITY)
Admission: EM | Admit: 2013-07-23 | Discharge: 2013-07-23 | Disposition: A | Discharge status: Home / Self Care | Payer: Medicaid Other | Attending: Emergency Medicine | Admitting: Emergency Medicine

## 2013-07-23 ENCOUNTER — Encounter (HOSPITAL_COMMUNITY): Payer: Self-pay | Admitting: Emergency Medicine

## 2013-07-23 DIAGNOSIS — F10929 Alcohol use, unspecified with intoxication, unspecified: Secondary | ICD-10-CM

## 2013-07-23 DIAGNOSIS — Z79899 Other long term (current) drug therapy: Secondary | ICD-10-CM | POA: Insufficient documentation

## 2013-07-23 DIAGNOSIS — Z8739 Personal history of other diseases of the musculoskeletal system and connective tissue: Secondary | ICD-10-CM | POA: Insufficient documentation

## 2013-07-23 DIAGNOSIS — J449 Chronic obstructive pulmonary disease, unspecified: Secondary | ICD-10-CM | POA: Insufficient documentation

## 2013-07-23 DIAGNOSIS — J4489 Other specified chronic obstructive pulmonary disease: Secondary | ICD-10-CM | POA: Insufficient documentation

## 2013-07-23 DIAGNOSIS — F1010 Alcohol abuse, uncomplicated: Secondary | ICD-10-CM | POA: Insufficient documentation

## 2013-07-23 DIAGNOSIS — Z87820 Personal history of traumatic brain injury: Secondary | ICD-10-CM | POA: Insufficient documentation

## 2013-07-23 DIAGNOSIS — I10 Essential (primary) hypertension: Secondary | ICD-10-CM | POA: Insufficient documentation

## 2013-07-23 DIAGNOSIS — R5381 Other malaise: Secondary | ICD-10-CM | POA: Insufficient documentation

## 2013-07-23 DIAGNOSIS — F17200 Nicotine dependence, unspecified, uncomplicated: Secondary | ICD-10-CM | POA: Insufficient documentation

## 2013-07-23 LAB — CBC WITH DIFFERENTIAL/PLATELET
Basophils Relative: 0 % (ref 0–1)
HCT: 53.3 % — ABNORMAL HIGH (ref 39.0–52.0)
Hemoglobin: 19.2 g/dL — ABNORMAL HIGH (ref 13.0–17.0)
Lymphocytes Relative: 42 % (ref 12–46)
MCHC: 36 g/dL (ref 30.0–36.0)
Monocytes Absolute: 1 10*3/uL (ref 0.1–1.0)
Monocytes Relative: 17 % — ABNORMAL HIGH (ref 3–12)
Neutro Abs: 2.4 10*3/uL (ref 1.7–7.7)
Neutrophils Relative %: 41 % — ABNORMAL LOW (ref 43–77)
RBC: 5.75 MIL/uL (ref 4.22–5.81)
WBC: 5.7 10*3/uL (ref 4.0–10.5)

## 2013-07-23 LAB — COMPREHENSIVE METABOLIC PANEL
AST: 60 U/L — ABNORMAL HIGH (ref 0–37)
Albumin: 3.7 g/dL (ref 3.5–5.2)
Alkaline Phosphatase: 77 U/L (ref 39–117)
BUN: 8 mg/dL (ref 6–23)
CO2: 20 mEq/L (ref 19–32)
Chloride: 101 mEq/L (ref 96–112)
Creatinine, Ser: 0.75 mg/dL (ref 0.50–1.35)
GFR calc non Af Amer: >90 mL/min (ref >90)
Potassium: 3.1 mEq/L — ABNORMAL LOW (ref 3.5–5.1)
Total Bilirubin: 0.5 mg/dL (ref 0.3–1.2)

## 2013-07-23 LAB — ETHANOL: Alcohol, Ethyl (B): 216 mg/dL — ABNORMAL HIGH (ref 0–11)

## 2013-07-23 MED ORDER — THIAMINE HCL 100 MG/ML IJ SOLN
100.0000 mg | Freq: Once | INTRAMUSCULAR | Status: AC
  Administered 2013-07-23: 100 mg via INTRAVENOUS
  Filled 2013-07-23: qty 2

## 2013-07-23 MED ORDER — SODIUM CHLORIDE 0.9 % IV SOLN
1000.0000 mL | Freq: Once | INTRAVENOUS | Status: AC
  Administered 2013-07-23: 1000 mL via INTRAVENOUS

## 2013-07-23 MED ORDER — SODIUM CHLORIDE 0.9 % IV SOLN: 1000.0000 mL | INTRAVENOUS | Status: DC
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
## 2013-07-23 NOTE — ED Notes (Signed)
Pt has 2 knives locked up with security in locker 28.
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
## 2014-03-16 ENCOUNTER — Emergency Department (HOSPITAL_COMMUNITY): Payer: Self-pay

## 2014-03-16 ENCOUNTER — Emergency Department (HOSPITAL_COMMUNITY): Payer: Medicaid Other

## 2014-03-16 ENCOUNTER — Encounter (HOSPITAL_COMMUNITY): Payer: Self-pay | Admitting: Emergency Medicine

## 2014-03-16 ENCOUNTER — Emergency Department (HOSPITAL_COMMUNITY)
Admission: EM | Admit: 2014-03-16 | Discharge: 2014-03-16 | Disposition: A | Discharge status: Home / Self Care | Payer: Self-pay | Attending: Emergency Medicine | Admitting: Emergency Medicine

## 2014-03-16 DIAGNOSIS — J4489 Other specified chronic obstructive pulmonary disease: Secondary | ICD-10-CM | POA: Insufficient documentation

## 2014-03-16 DIAGNOSIS — R05 Cough: Secondary | ICD-10-CM | POA: Insufficient documentation

## 2014-03-16 DIAGNOSIS — J449 Chronic obstructive pulmonary disease, unspecified: Secondary | ICD-10-CM | POA: Insufficient documentation

## 2014-03-16 DIAGNOSIS — I10 Essential (primary) hypertension: Secondary | ICD-10-CM | POA: Insufficient documentation

## 2014-03-16 DIAGNOSIS — Z87828 Personal history of other (healed) physical injury and trauma: Secondary | ICD-10-CM | POA: Insufficient documentation

## 2014-03-16 DIAGNOSIS — R059 Cough, unspecified: Secondary | ICD-10-CM | POA: Insufficient documentation

## 2014-03-16 DIAGNOSIS — R109 Unspecified abdominal pain: Secondary | ICD-10-CM

## 2014-03-16 DIAGNOSIS — F17200 Nicotine dependence, unspecified, uncomplicated: Secondary | ICD-10-CM | POA: Insufficient documentation

## 2014-03-16 DIAGNOSIS — K439 Ventral hernia without obstruction or gangrene: Secondary | ICD-10-CM | POA: Insufficient documentation

## 2014-03-16 DIAGNOSIS — R079 Chest pain, unspecified: Secondary | ICD-10-CM | POA: Insufficient documentation

## 2014-03-16 DIAGNOSIS — Z9089 Acquired absence of other organs: Secondary | ICD-10-CM | POA: Insufficient documentation

## 2014-03-16 DIAGNOSIS — Z79899 Other long term (current) drug therapy: Secondary | ICD-10-CM | POA: Insufficient documentation

## 2014-03-16 DIAGNOSIS — Z8739 Personal history of other diseases of the musculoskeletal system and connective tissue: Secondary | ICD-10-CM | POA: Insufficient documentation

## 2014-03-16 LAB — COMPREHENSIVE METABOLIC PANEL
ALBUMIN: 4.2 g/dL (ref 3.5–5.2)
ALT: 70 U/L — ABNORMAL HIGH (ref 0–53)
AST: 34 U/L (ref 0–37)
Alkaline Phosphatase: 72 U/L (ref 39–117)
BILIRUBIN TOTAL: 0.8 mg/dL (ref 0.3–1.2)
BUN: 12 mg/dL (ref 6–23)
CALCIUM: 9.7 mg/dL (ref 8.4–10.5)
CO2: 26 mEq/L (ref 19–32)
CREATININE: 0.86 mg/dL (ref 0.50–1.35)
Chloride: 96 mEq/L (ref 96–112)
GFR calc Af Amer: >90 mL/min (ref >90)
GFR calc non Af Amer: >90 mL/min (ref >90)
Glucose, Bld: 120 mg/dL — ABNORMAL HIGH (ref 70–99)
Potassium: 3.4 mEq/L — ABNORMAL LOW (ref 3.7–5.3)
Sodium: 137 mEq/L (ref 137–147)
Total Protein: 7.8 g/dL (ref 6.0–8.3)

## 2014-03-16 LAB — URINALYSIS, ROUTINE W REFLEX MICROSCOPIC
BILIRUBIN URINE: NEGATIVE
Glucose, UA: NEGATIVE mg/dL
Hgb urine dipstick: NEGATIVE
Ketones, ur: 15 mg/dL — AB
Leukocytes, UA: NEGATIVE
NITRITE: NEGATIVE
Protein, ur: NEGATIVE mg/dL
SPECIFIC GRAVITY, URINE: 1.019 (ref 1.005–1.030)
UROBILINOGEN UA: 1 mg/dL (ref 0.0–1.0)
pH: 6 (ref 5.0–8.0)

## 2014-03-16 LAB — ETHANOL: Alcohol, Ethyl (B): <11 mg/dL (ref 0–11)

## 2014-03-16 LAB — CBC
HEMATOCRIT: 51.3 % (ref 39.0–52.0)
Hemoglobin: 18.7 g/dL — ABNORMAL HIGH (ref 13.0–17.0)
MCH: 33.2 pg (ref 26.0–34.0)
MCHC: 36.5 g/dL — AB (ref 30.0–36.0)
MCV: 91 fL (ref 78.0–100.0)
Platelets: 177 10*3/uL (ref 150–400)
RBC: 5.64 MIL/uL (ref 4.22–5.81)
RDW: 12.6 % (ref 11.5–15.5)
WBC: 5.3 10*3/uL (ref 4.0–10.5)

## 2014-03-16 LAB — RAPID URINE DRUG SCREEN, HOSP PERFORMED
Amphetamines: NOT DETECTED
Barbiturates: NOT DETECTED
Benzodiazepines: NOT DETECTED
Cocaine: NOT DETECTED
Opiates: NOT DETECTED
TETRAHYDROCANNABINOL: POSITIVE — AB

## 2014-03-16 LAB — TROPONIN I

## 2014-03-16 LAB — LIPASE, BLOOD: LIPASE: 20 U/L (ref 11–59)

## 2014-03-16 MED ORDER — HYDROMORPHONE HCL PF 1 MG/ML IJ SOLN
1.0000 mg | Freq: Once | INTRAMUSCULAR | Status: AC
  Administered 2014-03-16: 1 mg via INTRAVENOUS
  Filled 2014-03-16: qty 1

## 2014-03-16 MED ORDER — TRAMADOL HCL 50 MG PO TABS: 50.0000 mg | ORAL_TABLET | Freq: Four times a day (QID) | ORAL | Status: DC | PRN

## 2014-03-16 MED ORDER — PANTOPRAZOLE SODIUM 40 MG PO TBEC: 40.0000 mg | DELAYED_RELEASE_TABLET | Freq: Every day | ORAL | Status: DC

## 2014-03-16 MED ORDER — ONDANSETRON HCL 4 MG/2ML IJ SOLN
4.0000 mg | Freq: Once | INTRAMUSCULAR | Status: AC
  Administered 2014-03-16: 4 mg via INTRAVENOUS
  Filled 2014-03-16: qty 2

## 2014-03-16 MED ORDER — IOHEXOL 300 MG/ML  SOLN
50.0000 mL | Freq: Once | INTRAMUSCULAR | Status: AC | PRN
  Administered 2014-03-16: 50 mL via ORAL

## 2014-03-16 MED ORDER — ONDANSETRON HCL 8 MG PO TABS
8.0000 mg | ORAL_TABLET | Freq: Three times a day (TID) | ORAL | Status: DC | PRN
Start: 2014-03-16 — End: 2014-04-29

## 2014-03-16 MED ORDER — SODIUM CHLORIDE 0.9 % IV BOLUS (SEPSIS)
1000.0000 mL | Freq: Once | INTRAVENOUS | Status: AC
  Administered 2014-03-16: 1000 mL via INTRAVENOUS

## 2014-03-16 MED ORDER — IOHEXOL 300 MG/ML  SOLN
100.0000 mL | Freq: Once | INTRAMUSCULAR | Status: AC | PRN
  Administered 2014-03-16: 100 mL via INTRAVENOUS
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
## 2014-03-16 NOTE — Progress Notes (Signed)
[facility] provided pt with a list of primary care resources, highlighting [facility], list of shelters, hot meals, food pantries, etc. Explained to patient about [employer].

## 2014-03-16 NOTE — ED Notes (Signed)
Pt reports abdominal pain/ issues x6 years. In 2009 had colostomy and hernia repair. Reports he has "pig mesh" in his stomach. Pt reports for last 2 years he wakes up every morning and dry heaves and stomach hurts, pt does not actually vomit. Pain 10/10. Pt reports he is homeless and had not been to see a doctor about this before.

## 2014-03-16 NOTE — Discharge Instructions (Signed)
Rest. Drink plenty of fluids.  Take protonix (acid blocker).  You may also take ultram as need for pain - no driving when taking.  You may take zofran as need for nausea.  If reflux symptoms, you may also try taking pepcid and maalox as need for symptom relief.  You ct scan shows a ventral/abdominal hernia. Follow up with general surgeon in the next couple weeks - see referral - call office to arrange appointment.  Your potassium level is slightly low today (3.4) - eat plenty of fruits and vegetables, and follow up with primary care doctor for recheck in 1 week.  Your blood pressure is high, follow up with primary care doctor in coming week.   Return to ER if worse, new symptoms, fevers, persistent vomiting, worsening or severe abdominal pain, other concern.  You were given pain medication in the ER - no driving for the next 4 hours.     Abdominal Pain, Adult Many things can cause abdominal pain. Usually, abdominal pain is not caused by a disease and will improve without treatment. It can often be observed and treated at home. Your health care provider will do a physical exam and possibly order blood tests and X-rays to help determine the seriousness of your pain. However, in many cases, more time must pass before a clear cause of the pain can be found. Before that point, your health care provider may not know if you need more testing or further treatment. HOME CARE INSTRUCTIONS  Monitor your abdominal pain for any changes. The following actions may help to alleviate any discomfort you are experiencing:  Only take over-the-counter or prescription medicines as directed by your health care provider.  Do not take laxatives unless directed to do so by your health care provider.  Try a clear liquid diet (broth, tea, or water) as directed by your health care provider. Slowly move to a bland diet as tolerated. SEEK MEDICAL CARE IF:  You have unexplained abdominal pain.  You have abdominal  pain associated with nausea or diarrhea.  You have pain when you urinate or have a bowel movement.  You experience abdominal pain that wakes you in the night.  You have abdominal pain that is worsened or improved by eating food.  You have abdominal pain that is worsened with eating fatty foods. SEEK IMMEDIATE MEDICAL CARE IF:   Your pain does not go away within 2 hours.  You have a fever.  You keep throwing up (vomiting).  Your pain is felt only in portions of the abdomen, such as the right side or the left lower portion of the abdomen.  You pass bloody or black tarry stools. MAKE SURE YOU:  Understand these instructions.   Will watch your condition.   Will get help right away if you are not doing well or get worse.  Document Released: 07/03/2005 Document Revised: 07/14/2013 Document Reviewed: 06/02/2013 [facility] Patient Information 2014 [place], [place].    Hernia A hernia occurs when an internal organ pushes out through a weak spot in the abdominal wall. Hernias most commonly occur in the groin and around the navel. Hernias often can be pushed back into place (reduced). Most hernias tend to get worse over time. Some abdominal hernias can get stuck in the opening (irreducible or incarcerated hernia) and cannot be reduced. An irreducible abdominal hernia which is tightly squeezed into the opening is at risk for impaired blood supply (strangulated hernia). A strangulated hernia is a medical emergency. Because of the risk  for an irreducible or strangulated hernia, surgery may be recommended to repair a hernia. CAUSES   Heavy lifting.  Prolonged coughing.  Straining to have a bowel movement.  A cut (incision) made during an abdominal surgery. HOME CARE INSTRUCTIONS   Bed rest is not required. You may continue your normal activities.  Avoid lifting more than 10 pounds (4.5 kg) or straining.  Cough gently. If you are a smoker it is best to stop. Even the best hernia  repair can break down with the continual strain of coughing. Even if you do not have your hernia repaired, a cough will continue to aggravate the problem.  Do not wear anything tight over your hernia. Do not try to keep it in with an outside bandage or truss. These can damage abdominal contents if they are trapped within the hernia sac.  Eat a normal diet.  Avoid constipation. Straining over long periods of time will increase hernia size and encourage breakdown of repairs. If you cannot do this with diet alone, stool softeners may be used. SEEK IMMEDIATE MEDICAL CARE IF:   You have a fever.  You develop increasing abdominal pain.  You feel nauseous or vomit.  Your hernia is stuck outside the abdomen, looks discolored, feels hard, or is tender.  You have any changes in your bowel habits or in the hernia that are unusual for you.  You have increased pain or swelling around the hernia.  You cannot push the hernia back in place by applying gentle pressure while lying down. MAKE SURE YOU:   Understand these instructions.  Will watch your condition.  Will get help right away if you are not doing well or get worse. Document Released: 09/23/2005 Document Revised: 12/16/2011 Document Reviewed: 05/12/2008 [facility] Patient Information 2014 [place], [place].   Hypokalemia Hypokalemia means that the amount of potassium in the blood is lower than normal.Potassium is a chemical, called an electrolyte, that helps regulate the amount of fluid in the body. It also stimulates muscle contraction and helps nerves function properly.Most of the body's potassium is inside of cells, and only a very small amount is in the blood. Because the amount in the blood is so small, minor changes can be life-threatening. CAUSES  Antibiotics.  Diarrhea or vomiting.  Using laxatives too much, which can cause diarrhea.  Chronic kidney disease.  Water pills (diuretics).  Eating disorders (bulimia).  Low  magnesium level.  Sweating a lot. SIGNS AND SYMPTOMS  Weakness.  Constipation.  Fatigue.  Muscle cramps.  Mental confusion.  Skipped heartbeats or irregular heartbeat (palpitations).  Tingling or numbness. DIAGNOSIS  Your health care provider can diagnose hypokalemia with blood tests. In addition to checking your potassium level, your health care provider may also check other lab tests. TREATMENT Hypokalemia can be treated with potassium supplements taken by mouth or adjustments in your current medicines. If your potassium level is very low, you may need to get potassium through a vein (IV) and be monitored in the hospital. A diet high in potassium is also helpful. Foods high in potassium are:  Nuts, such as peanuts and pistachios.  Seeds, such as sunflower seeds and pumpkin seeds.  Peas, lentils, and lima beans.  Whole grain and bran cereals and breads.  Fresh fruit and vegetables, such as apricots, avocado, bananas, cantaloupe, kiwi, oranges, tomatoes, asparagus, and potatoes.  Orange and tomato juices.  Red meats.  Fruit yogurt. HOME CARE INSTRUCTIONS  Take all medicines as prescribed by your health care provider.  Maintain  a healthy diet by including nutritious food, such as fruits, vegetables, nuts, whole grains, and lean meats.  If you are taking a laxative, be sure to follow the directions on the label. SEEK MEDICAL CARE IF:  Your weakness gets worse.  You feel your heart pounding or racing.  You are vomiting or having diarrhea.  You are diabetic and having trouble keeping your blood glucose in the normal range. SEEK IMMEDIATE MEDICAL CARE IF:  You have chest pain, shortness of breath, or dizziness.  You are vomiting or having diarrhea for more than 2 days.  You faint. MAKE SURE YOU:   Understand these instructions.  Will watch your condition.  Will get help right away if you are not doing well or get worse. Document Released: 09/23/2005  Document Revised: 07/14/2013 Document Reviewed: 03/26/2013 [facility] Patient Information 2014 [place], [place].    Hypertension As your heart beats, it forces blood through your arteries. This force is your blood pressure. If the pressure is too high, it is called hypertension (HTN) or high blood pressure. HTN is dangerous because you may have it and not know it. High blood pressure may mean that your heart has to work harder to pump blood. Your arteries may be narrow or stiff. The extra work puts you at risk for heart disease, stroke, and other problems.  Blood pressure consists of two numbers, a higher number over a lower, 110/72, for example. It is stated as "110 over 72." The ideal is below 120 for the top number (systolic) and under 80 for the bottom (diastolic). Write down your blood pressure today. You should pay close attention to your blood pressure if you have certain conditions such as:  Heart failure.  Prior heart attack.  Diabetes  Chronic kidney disease.  Prior stroke.  Multiple risk factors for heart disease. To see if you have HTN, your blood pressure should be measured while you are seated with your arm held at the level of the heart. It should be measured at least twice. A one-time elevated blood pressure reading (especially in the Emergency Department) does not mean that you need treatment. There may be conditions in which the blood pressure is different between your right and left arms. It is important to see your caregiver soon for a recheck. Most people have essential hypertension which means that there is not a specific cause. This type of high blood pressure may be lowered by changing lifestyle factors such as:  Stress.  Smoking.  Lack of exercise.  Excessive weight.  Drug/tobacco/alcohol use.  Eating less salt. Most people do not have symptoms from high blood pressure until it has caused damage to the body. Effective treatment can often prevent, delay or  reduce that damage. TREATMENT  When a cause has been identified, treatment for high blood pressure is directed at the cause. There are a large number of medications to treat HTN. These fall into several categories, and your caregiver will help you select the medicines that are best for you. Medications may have side effects. You should review side effects with your caregiver. If your blood pressure stays high after you have made lifestyle changes or started on medicines,   Your medication(s) may need to be changed.  Other problems may need to be addressed.  Be certain you understand your prescriptions, and know how and when to take your medicine.  Be sure to follow up with your caregiver within the time frame advised (usually within two weeks) to have your blood  pressure rechecked and to review your medications.  If you are taking more than one medicine to lower your blood pressure, make sure you know how and at what times they should be taken. Taking two medicines at the same time can result in blood pressure that is too low. SEEK IMMEDIATE MEDICAL CARE IF:  You develop a severe headache, blurred or changing vision, or confusion.  You have unusual weakness or numbness, or a faint feeling.  You have severe chest or abdominal pain, vomiting, or breathing problems. MAKE SURE YOU:   Understand these instructions.  Will watch your condition.  Will get help right away if you are not doing well or get worse. Document Released: 09/23/2005 Document Revised: 12/16/2011 Document Reviewed: 05/13/2008 [facility] Patient Information 2014 [place], [place].   Gastritis, Adult Gastritis is soreness and swelling (inflammation) of the lining of the stomach. Gastritis can develop as a sudden onset (acute) or long-term (chronic) condition. If gastritis is not treated, it can lead to stomach bleeding and ulcers. CAUSES  Gastritis occurs when the stomach lining is weak or damaged. Digestive juices from the  stomach then inflame the weakened stomach lining. The stomach lining may be weak or damaged due to viral or bacterial infections. One common bacterial infection is the Helicobacter pylori infection. Gastritis can also result from excessive alcohol consumption, taking certain medicines, or having too much acid in the stomach.  SYMPTOMS  In some cases, there are no symptoms. When symptoms are present, they may include:  Pain or a burning sensation in the upper abdomen.  Nausea.  Vomiting.  An uncomfortable feeling of fullness after eating. DIAGNOSIS  Your caregiver may suspect you have gastritis based on your symptoms and a physical exam. To determine the cause of your gastritis, your caregiver may perform the following:  Blood or stool tests to check for the H pylori bacterium.  Gastroscopy. A thin, flexible tube (endoscope) is passed down the esophagus and into the stomach. The endoscope has a light and camera on the end. Your caregiver uses the endoscope to view the inside of the stomach.  Taking a tissue sample (biopsy) from the stomach to examine under a microscope. TREATMENT  Depending on the cause of your gastritis, medicines may be prescribed. If you have a bacterial infection, such as an H pylori infection, antibiotics may be given. If your gastritis is caused by too much acid in the stomach, H2 blockers or antacids may be given. Your caregiver may recommend that you stop taking aspirin, ibuprofen, or other nonsteroidal anti-inflammatory drugs (NSAIDs). HOME CARE INSTRUCTIONS  Only take over-the-counter or prescription medicines as directed by your caregiver.  If you were given antibiotic medicines, take them as directed. Finish them even if you start to feel better.  Drink enough fluids to keep your urine clear or pale yellow.  Avoid foods and drinks that make your symptoms worse, such as:  Caffeine or alcoholic drinks.  Chocolate.  Peppermint or mint flavorings.  Garlic  and onions.  Spicy foods.  Citrus fruits, such as oranges, lemons, or limes.  Tomato-based foods such as sauce, chili, salsa, and pizza.  Fried and fatty foods.  Eat small, frequent meals instead of large meals. SEEK IMMEDIATE MEDICAL CARE IF:   You have black or dark red stools.  You vomit blood or material that looks like coffee grounds.  You are unable to keep fluids down.  Your abdominal pain gets worse.  You have a fever.  You do not feel better after  1 week.  You have any other questions or concerns. MAKE SURE YOU:  Understand these instructions.  Will watch your condition.  Will get help right away if you are not doing well or get worse. Document Released: 09/17/2001 Document Revised: 03/24/2012 Document Reviewed: 11/06/2011 [facility] Patient Information 2014 [place].   Gastroesophageal Reflux Disease, Adult Gastroesophageal reflux disease (GERD) happens when acid from your stomach flows up into the esophagus. When acid comes in contact with the esophagus, the acid causes soreness (inflammation) in the esophagus. Over time, GERD may create small holes (ulcers) in the lining of the esophagus. CAUSES   Increased body weight. This puts pressure on the stomach, making acid rise from the stomach into the esophagus.  Smoking. This increases acid production in the stomach.  Drinking alcohol. This causes decreased pressure in the lower esophageal sphincter (valve or ring of muscle between the esophagus and stomach), allowing acid from the stomach into the esophagus.  Late evening meals and a full stomach. This increases pressure and acid production in the stomach.  A malformed lower esophageal sphincter. Sometimes, no cause is found. SYMPTOMS   Burning pain in the lower part of the mid-chest behind the breastbone and in the mid-stomach area. This may occur twice a week or more often.  Trouble swallowing.  Sore throat.  Dry cough.  Asthma-like  symptoms including chest tightness, shortness of breath, or wheezing. DIAGNOSIS  Your caregiver may be able to diagnose GERD based on your symptoms. In some cases, X-rays and other tests may be done to check for complications or to check the condition of your stomach and esophagus. TREATMENT  Your caregiver may recommend over-the-counter or prescription medicines to help decrease acid production. Ask your caregiver before starting or adding any new medicines.  HOME CARE INSTRUCTIONS   Change the factors that you can control. Ask your caregiver for guidance concerning weight loss, quitting smoking, and alcohol consumption.  Avoid foods and drinks that make your symptoms worse, such as:  Caffeine or alcoholic drinks.  Chocolate.  Peppermint or mint flavorings.  Garlic and onions.  Spicy foods.  Citrus fruits, such as oranges, lemons, or limes.  Tomato-based foods such as sauce, chili, salsa, and pizza.  Fried and fatty foods.  Avoid lying down for the 3 hours prior to your bedtime or prior to taking a nap.  Eat small, frequent meals instead of large meals.  Wear loose-fitting clothing. Do not wear anything tight around your waist that causes pressure on your stomach.  Raise the head of your bed 6 to 8 inches with wood blocks to help you sleep. Extra pillows will not help.  Only take over-the-counter or prescription medicines for pain, discomfort, or fever as directed by your caregiver.  Do not take aspirin, ibuprofen, or other nonsteroidal anti-inflammatory drugs (NSAIDs). SEEK IMMEDIATE MEDICAL CARE IF:   You have pain in your arms, neck, jaw, teeth, or back.  Your pain increases or changes in intensity or duration.  You develop nausea, vomiting, or sweating (diaphoresis).  You develop shortness of breath, or you faint.  Your vomit is green, yellow, black, or looks like coffee grounds or blood.  Your stool is red, bloody, or black. These symptoms could be signs of  other problems, such as heart disease, gastric bleeding, or esophageal bleeding. MAKE SURE YOU:   Understand these instructions.  Will watch your condition.  Will get help right away if you are not doing well or get worse. Document Released: 07/03/2005 Document Revised:  12/16/2011 Document Reviewed: 04/12/2011 [employer] Patient Information 2014 [place], [place].

## 2014-03-16 NOTE — ED Notes (Signed)
Pt aware that a urine sample is needed but sts he is currently unable to go.  Will recheck with pt within the hour.

## 2014-04-29 ENCOUNTER — Encounter (HOSPITAL_COMMUNITY): Payer: Self-pay | Admitting: Emergency Medicine

## 2014-04-29 ENCOUNTER — Emergency Department (HOSPITAL_COMMUNITY)
Admission: EM | Admit: 2014-04-29 | Discharge: 2014-04-29 | Disposition: A | Discharge status: Home / Self Care | Payer: No Typology Code available for payment source | Attending: Emergency Medicine | Admitting: Emergency Medicine

## 2014-04-29 DIAGNOSIS — Y9389 Activity, other specified: Secondary | ICD-10-CM | POA: Insufficient documentation

## 2014-04-29 DIAGNOSIS — M545 Low back pain, unspecified: Secondary | ICD-10-CM

## 2014-04-29 DIAGNOSIS — J4489 Other specified chronic obstructive pulmonary disease: Secondary | ICD-10-CM | POA: Insufficient documentation

## 2014-04-29 DIAGNOSIS — J449 Chronic obstructive pulmonary disease, unspecified: Secondary | ICD-10-CM | POA: Insufficient documentation

## 2014-04-29 DIAGNOSIS — I10 Essential (primary) hypertension: Secondary | ICD-10-CM | POA: Insufficient documentation

## 2014-04-29 DIAGNOSIS — F17200 Nicotine dependence, unspecified, uncomplicated: Secondary | ICD-10-CM | POA: Insufficient documentation

## 2014-04-29 DIAGNOSIS — IMO0002 Reserved for concepts with insufficient information to code with codable children: Secondary | ICD-10-CM | POA: Insufficient documentation

## 2014-04-29 DIAGNOSIS — Z8739 Personal history of other diseases of the musculoskeletal system and connective tissue: Secondary | ICD-10-CM | POA: Insufficient documentation

## 2014-04-29 DIAGNOSIS — Z79899 Other long term (current) drug therapy: Secondary | ICD-10-CM | POA: Insufficient documentation

## 2014-04-29 DIAGNOSIS — Y92410 Unspecified street and highway as the place of occurrence of the external cause: Secondary | ICD-10-CM | POA: Insufficient documentation

## 2014-04-29 MED ORDER — TRAMADOL HCL 50 MG PO TABS: 50.0000 mg | ORAL_TABLET | Freq: Four times a day (QID) | ORAL | Status: DC | PRN

## 2014-04-29 NOTE — Discharge Instructions (Signed)
Take Tramadol as needed for pain. Refer to attached documents for more information. Follow up with your doctor as needed.  °
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
## 2014-05-02 NOTE — ED Provider Notes (Signed)
History/physical exam/procedure(s) were performed by non-physician practitioner and as supervising physician I was immediately available for consultation/collaboration. I have reviewed all notes and am in agreement with care and plan.   Hilario Quarry, MD 05/02/14 [phone]

## 2014-05-07 ENCOUNTER — Inpatient Hospital Stay (HOSPITAL_COMMUNITY)
Admission: EM | Admit: 2014-05-07 | Discharge: 2014-05-10 | DRG: 395 | Disposition: A | Discharge status: Home / Self Care | Payer: Medicaid Other | Attending: General Surgery | Admitting: General Surgery

## 2014-05-07 ENCOUNTER — Encounter (HOSPITAL_COMMUNITY): Payer: Self-pay | Admitting: Emergency Medicine

## 2014-05-07 ENCOUNTER — Emergency Department (HOSPITAL_COMMUNITY): Payer: Medicaid Other

## 2014-05-07 DIAGNOSIS — K5289 Other specified noninfective gastroenteritis and colitis: Secondary | ICD-10-CM

## 2014-05-07 DIAGNOSIS — F411 Generalized anxiety disorder: Secondary | ICD-10-CM | POA: Diagnosis present

## 2014-05-07 DIAGNOSIS — Z9049 Acquired absence of other specified parts of digestive tract: Secondary | ICD-10-CM

## 2014-05-07 DIAGNOSIS — F419 Anxiety disorder, unspecified: Secondary | ICD-10-CM

## 2014-05-07 DIAGNOSIS — K631 Perforation of intestine (nontraumatic): Principal | ICD-10-CM | POA: Diagnosis present

## 2014-05-07 DIAGNOSIS — Z720 Tobacco use: Secondary | ICD-10-CM

## 2014-05-07 DIAGNOSIS — J449 Chronic obstructive pulmonary disease, unspecified: Secondary | ICD-10-CM | POA: Diagnosis present

## 2014-05-07 DIAGNOSIS — M479 Spondylosis, unspecified: Secondary | ICD-10-CM | POA: Diagnosis present

## 2014-05-07 DIAGNOSIS — K219 Gastro-esophageal reflux disease without esophagitis: Secondary | ICD-10-CM

## 2014-05-07 DIAGNOSIS — Z833 Family history of diabetes mellitus: Secondary | ICD-10-CM

## 2014-05-07 DIAGNOSIS — J4489 Other specified chronic obstructive pulmonary disease: Secondary | ICD-10-CM | POA: Diagnosis present

## 2014-05-07 DIAGNOSIS — Z791 Long term (current) use of non-steroidal anti-inflammatories (NSAID): Secondary | ICD-10-CM

## 2014-05-07 DIAGNOSIS — Z79899 Other long term (current) drug therapy: Secondary | ICD-10-CM

## 2014-05-07 DIAGNOSIS — R1032 Left lower quadrant pain: Secondary | ICD-10-CM

## 2014-05-07 DIAGNOSIS — Z9089 Acquired absence of other organs: Secondary | ICD-10-CM

## 2014-05-07 DIAGNOSIS — F17200 Nicotine dependence, unspecified, uncomplicated: Secondary | ICD-10-CM | POA: Diagnosis present

## 2014-05-07 DIAGNOSIS — I10 Essential (primary) hypertension: Secondary | ICD-10-CM | POA: Diagnosis present

## 2014-05-07 DIAGNOSIS — F410 Panic disorder [episodic paroxysmal anxiety] without agoraphobia: Secondary | ICD-10-CM | POA: Diagnosis present

## 2014-05-07 HISTORY — DX: Diverticulitis of large intestine without perforation or abscess without bleeding: K57.32

## 2014-05-07 LAB — URINALYSIS, ROUTINE W REFLEX MICROSCOPIC
Bilirubin Urine: NEGATIVE
Glucose, UA: NEGATIVE mg/dL
Hgb urine dipstick: NEGATIVE
KETONES UR: NEGATIVE mg/dL
LEUKOCYTES UA: NEGATIVE
NITRITE: NEGATIVE
PH: 7 (ref 5.0–8.0)
Protein, ur: NEGATIVE mg/dL
Specific Gravity, Urine: 1.013 (ref 1.005–1.030)
Urobilinogen, UA: 1 mg/dL (ref 0.0–1.0)

## 2014-05-07 LAB — COMPREHENSIVE METABOLIC PANEL
ALT: 33 U/L (ref 0–53)
AST: 22 U/L (ref 0–37)
Albumin: 3.8 g/dL (ref 3.5–5.2)
Alkaline Phosphatase: 68 U/L (ref 39–117)
Anion gap: 15 (ref 5–15)
BUN: 11 mg/dL (ref 6–23)
CO2: 24 meq/L (ref 19–32)
CREATININE: 0.75 mg/dL (ref 0.50–1.35)
Calcium: 9.5 mg/dL (ref 8.4–10.5)
Chloride: 96 mEq/L (ref 96–112)
GFR calc Af Amer: >90 mL/min (ref >90)
Glucose, Bld: 137 mg/dL — ABNORMAL HIGH (ref 70–99)
Potassium: 3.6 mEq/L — ABNORMAL LOW (ref 3.7–5.3)
Sodium: 135 mEq/L — ABNORMAL LOW (ref 137–147)
TOTAL PROTEIN: 7.8 g/dL (ref 6.0–8.3)
Total Bilirubin: 0.7 mg/dL (ref 0.3–1.2)

## 2014-05-07 LAB — CBC WITH DIFFERENTIAL/PLATELET
BASOS ABS: 0 10*3/uL (ref 0.0–0.1)
Basophils Relative: 0 % (ref 0–1)
EOS PCT: 0 % (ref 0–5)
Eosinophils Absolute: 0 10*3/uL (ref 0.0–0.7)
HCT: 48.2 % (ref 39.0–52.0)
Hemoglobin: 16.8 g/dL (ref 13.0–17.0)
Lymphocytes Relative: 11 % — ABNORMAL LOW (ref 12–46)
Lymphs Abs: 0.9 10*3/uL (ref 0.7–4.0)
MCH: 32.1 pg (ref 26.0–34.0)
MCHC: 34.9 g/dL (ref 30.0–36.0)
MCV: 92.2 fL (ref 78.0–100.0)
Monocytes Absolute: 1 10*3/uL (ref 0.1–1.0)
Monocytes Relative: 12 % (ref 3–12)
NEUTROS PCT: 77 % (ref 43–77)
Neutro Abs: 6.8 10*3/uL (ref 1.7–7.7)
Platelets: 173 10*3/uL (ref 150–400)
RBC: 5.23 MIL/uL (ref 4.22–5.81)
RDW: 13 % (ref 11.5–15.5)
WBC: 8.8 10*3/uL (ref 4.0–10.5)

## 2014-05-07 LAB — LIPASE, BLOOD: LIPASE: 14 U/L (ref 11–59)

## 2014-05-07 MED ORDER — LEVALBUTEROL HCL 0.63 MG/3ML IN NEBU
0.6300 mg | INHALATION_SOLUTION | Freq: Four times a day (QID) | RESPIRATORY_TRACT | Status: DC | PRN
Start: 2014-05-07 — End: 2014-05-10

## 2014-05-07 MED ORDER — ACETAMINOPHEN 500 MG PO TABS
1000.0000 mg | ORAL_TABLET | Freq: Four times a day (QID) | ORAL | Status: DC | PRN
  Administered 2014-05-08 – 2014-05-09 (×4): 1000 mg via ORAL
  Filled 2014-05-07 (×4): qty 2

## 2014-05-07 MED ORDER — CIPROFLOXACIN IN D5W 400 MG/200ML IV SOLN
400.0000 mg | Freq: Two times a day (BID) | INTRAVENOUS | Status: DC
  Administered 2014-05-08 – 2014-05-10 (×5): 400 mg via INTRAVENOUS
  Filled 2014-05-07 (×7): qty 200

## 2014-05-07 MED ORDER — SODIUM CHLORIDE 0.9 % IV BOLUS (SEPSIS)
1000.0000 mL | INTRAVENOUS | Status: AC
  Administered 2014-05-07: 1000 mL via INTRAVENOUS

## 2014-05-07 MED ORDER — METRONIDAZOLE IN NACL 5-0.79 MG/ML-% IV SOLN
500.0000 mg | Freq: Three times a day (TID) | INTRAVENOUS | Status: DC
  Administered 2014-05-07 – 2014-05-10 (×9): 500 mg via INTRAVENOUS
  Filled 2014-05-07 (×10): qty 100

## 2014-05-07 MED ORDER — HYDROMORPHONE HCL PF 1 MG/ML IJ SOLN
1.0000 mg | INTRAMUSCULAR | Status: AC | PRN
  Administered 2014-05-07 – 2014-05-08 (×7): 1 mg via INTRAVENOUS
  Filled 2014-05-07 (×7): qty 1

## 2014-05-07 MED ORDER — LISINOPRIL-HYDROCHLOROTHIAZIDE 20-25 MG PO TABS: 1.0000 | ORAL_TABLET | Freq: Every day | ORAL | Status: DC

## 2014-05-07 MED ORDER — ONDANSETRON HCL 4 MG/2ML IJ SOLN: 4.0000 mg | Freq: Four times a day (QID) | INTRAMUSCULAR | Status: DC | PRN

## 2014-05-07 MED ORDER — ALUM & MAG HYDROXIDE-SIMETH 200-200-20 MG/5ML PO SUSP: 30.0000 mL | Freq: Four times a day (QID) | ORAL | Status: DC | PRN

## 2014-05-07 MED ORDER — METRONIDAZOLE IN NACL 5-0.79 MG/ML-% IV SOLN
500.0000 mg | Freq: Once | INTRAVENOUS | Status: AC
  Administered 2014-05-07: 500 mg via INTRAVENOUS
  Filled 2014-05-07: qty 100

## 2014-05-07 MED ORDER — SODIUM CHLORIDE 0.9 % IV SOLN
Freq: Once | INTRAVENOUS | Status: AC
  Administered 2014-05-07: 12:00:00 via INTRAVENOUS

## 2014-05-07 MED ORDER — HYDROMORPHONE HCL PF 1 MG/ML IJ SOLN
1.0000 mg | Freq: Once | INTRAMUSCULAR | Status: AC
  Administered 2014-05-07: 1 mg via INTRAVENOUS
  Filled 2014-05-07: qty 1

## 2014-05-07 MED ORDER — LISINOPRIL 20 MG PO TABS
20.0000 mg | ORAL_TABLET | Freq: Every day | ORAL | Status: DC
  Administered 2014-05-08 – 2014-05-10 (×3): 20 mg via ORAL
  Filled 2014-05-07 (×3): qty 1

## 2014-05-07 MED ORDER — HYDROMORPHONE HCL PF 1 MG/ML IJ SOLN
1.0000 mg | INTRAMUSCULAR | Status: AC
  Administered 2014-05-07: 1 mg via INTRAVENOUS
  Filled 2014-05-07: qty 1

## 2014-05-07 MED ORDER — HYDROCHLOROTHIAZIDE 25 MG PO TABS
25.0000 mg | ORAL_TABLET | Freq: Every day | ORAL | Status: DC
  Administered 2014-05-08 – 2014-05-10 (×3): 25 mg via ORAL
  Filled 2014-05-07 (×3): qty 1

## 2014-05-07 MED ORDER — AMLODIPINE BESYLATE 5 MG PO TABS
5.0000 mg | ORAL_TABLET | Freq: Every day | ORAL | Status: DC
  Administered 2014-05-08 – 2014-05-10 (×3): 5 mg via ORAL
  Filled 2014-05-07 (×3): qty 1

## 2014-05-07 MED ORDER — CIPROFLOXACIN IN D5W 400 MG/200ML IV SOLN
400.0000 mg | Freq: Once | INTRAVENOUS | Status: AC
  Administered 2014-05-07: 400 mg via INTRAVENOUS
  Filled 2014-05-07: qty 200

## 2014-05-07 MED ORDER — METRONIDAZOLE IN NACL 5-0.79 MG/ML-% IV SOLN
500.0000 mg | Freq: Three times a day (TID) | INTRAVENOUS | Status: DC
  Filled 2014-05-07: qty 100

## 2014-05-07 MED ORDER — ONDANSETRON HCL 4 MG/2ML IJ SOLN
4.0000 mg | Freq: Once | INTRAMUSCULAR | Status: AC
  Administered 2014-05-07: 4 mg via INTRAVENOUS
  Filled 2014-05-07: qty 2

## 2014-05-07 MED ORDER — HYDROMORPHONE HCL PF 1 MG/ML IJ SOLN
0.5000 mg | INTRAMUSCULAR | Status: DC | PRN
  Administered 2014-05-08: 0.5 mg via INTRAVENOUS
  Filled 2014-05-07 (×2): qty 1

## 2014-05-07 MED ORDER — CIPROFLOXACIN IN D5W 400 MG/200ML IV SOLN: 400.0000 mg | Freq: Two times a day (BID) | INTRAVENOUS | Status: DC

## 2014-05-07 MED ORDER — ENOXAPARIN SODIUM 40 MG/0.4ML ~~LOC~~ SOLN
40.0000 mg | SUBCUTANEOUS | Status: DC
  Administered 2014-05-07 – 2014-05-10 (×4): 40 mg via SUBCUTANEOUS
  Filled 2014-05-07 (×4): qty 0.4

## 2014-05-07 MED ORDER — SODIUM CHLORIDE 0.9 % IV SOLN: INTRAVENOUS | Status: AC

## 2014-05-07 MED ORDER — ONDANSETRON HCL 4 MG PO TABS: 4.0000 mg | ORAL_TABLET | Freq: Four times a day (QID) | ORAL | Status: DC | PRN

## 2014-05-07 MED ORDER — SODIUM CHLORIDE 0.9 % IV SOLN
INTRAVENOUS | Status: DC
  Administered 2014-05-07 – 2014-05-10 (×5): via INTRAVENOUS

## 2014-05-07 MED ORDER — NICOTINE 14 MG/24HR TD PT24
14.0000 mg | MEDICATED_PATCH | Freq: Every day | TRANSDERMAL | Status: DC
  Administered 2014-05-07 – 2014-05-10 (×4): 14 mg via TRANSDERMAL
  Filled 2014-05-07 (×4): qty 1

## 2014-05-07 MED ORDER — ALBUTEROL SULFATE (2.5 MG/3ML) 0.083% IN NEBU: 2.5000 mg | INHALATION_SOLUTION | RESPIRATORY_TRACT | Status: DC | PRN
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
## 2014-05-07 NOTE — ED Notes (Signed)
Patient will try to urinate once fluids are ran a little bit and urinal is placed at bed side.

## 2014-05-07 NOTE — ED Notes (Signed)
Pt provided with urinal to give sample
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
## 2014-05-07 NOTE — ED Notes (Signed)
Patient still is unable to urinate, will try again soon.

## 2014-05-08 ENCOUNTER — Encounter (HOSPITAL_COMMUNITY): Payer: Self-pay | Admitting: Surgery

## 2014-05-08 DIAGNOSIS — K631 Perforation of intestine (nontraumatic): Principal | ICD-10-CM

## 2014-05-08 DIAGNOSIS — I10 Essential (primary) hypertension: Secondary | ICD-10-CM

## 2014-05-08 DIAGNOSIS — K219 Gastro-esophageal reflux disease without esophagitis: Secondary | ICD-10-CM

## 2014-05-08 DIAGNOSIS — J449 Chronic obstructive pulmonary disease, unspecified: Secondary | ICD-10-CM

## 2014-05-08 DIAGNOSIS — F17200 Nicotine dependence, unspecified, uncomplicated: Secondary | ICD-10-CM

## 2014-05-08 DIAGNOSIS — F411 Generalized anxiety disorder: Secondary | ICD-10-CM

## 2014-05-08 LAB — COMPREHENSIVE METABOLIC PANEL
ALBUMIN: 3.1 g/dL — AB (ref 3.5–5.2)
ALK PHOS: 97 U/L (ref 39–117)
ALT: 106 U/L — ABNORMAL HIGH (ref 0–53)
AST: 99 U/L — ABNORMAL HIGH (ref 0–37)
Anion gap: 11 (ref 5–15)
BUN: 6 mg/dL (ref 6–23)
CALCIUM: 8.6 mg/dL (ref 8.4–10.5)
CO2: 28 mEq/L (ref 19–32)
CREATININE: 0.8 mg/dL (ref 0.50–1.35)
Chloride: 98 mEq/L (ref 96–112)
GFR calc Af Amer: >90 mL/min (ref >90)
GFR calc non Af Amer: >90 mL/min (ref >90)
GLUCOSE: 108 mg/dL — AB (ref 70–99)
POTASSIUM: 3.4 meq/L — AB (ref 3.7–5.3)
Sodium: 137 mEq/L (ref 137–147)
TOTAL PROTEIN: 7.1 g/dL (ref 6.0–8.3)
Total Bilirubin: 1.2 mg/dL (ref 0.3–1.2)

## 2014-05-08 LAB — CBC
HCT: 44.5 % (ref 39.0–52.0)
Hemoglobin: 15.6 g/dL (ref 13.0–17.0)
MCH: 32.4 pg (ref 26.0–34.0)
MCHC: 35.1 g/dL (ref 30.0–36.0)
MCV: 92.3 fL (ref 78.0–100.0)
PLATELETS: 145 10*3/uL — AB (ref 150–400)
RBC: 4.82 MIL/uL (ref 4.22–5.81)
RDW: 12.7 % (ref 11.5–15.5)
WBC: 5.3 10*3/uL (ref 4.0–10.5)

## 2014-05-08 LAB — RAPID URINE DRUG SCREEN, HOSP PERFORMED
Amphetamines: NOT DETECTED
BENZODIAZEPINES: NOT DETECTED
Barbiturates: NOT DETECTED
COCAINE: NOT DETECTED
OPIATES: POSITIVE — AB
TETRAHYDROCANNABINOL: POSITIVE — AB

## 2014-05-08 MED ORDER — ALPRAZOLAM 0.5 MG PO TABS
0.5000 mg | ORAL_TABLET | Freq: Three times a day (TID) | ORAL | Status: DC | PRN
  Administered 2014-05-08 – 2014-05-09 (×4): 0.5 mg via ORAL
  Filled 2014-05-08 (×4): qty 1

## 2014-05-08 MED ORDER — HYDROMORPHONE HCL PF 1 MG/ML IJ SOLN
0.5000 mg | INTRAMUSCULAR | Status: DC | PRN
  Administered 2014-05-09 (×2): 1 mg via INTRAVENOUS
  Administered 2014-05-09: 2 mg via INTRAVENOUS
  Administered 2014-05-09: 1 mg via INTRAVENOUS
  Administered 2014-05-09: 2 mg via INTRAVENOUS
  Administered 2014-05-09 (×2): 1 mg via INTRAVENOUS
  Administered 2014-05-09: 2 mg via INTRAVENOUS
  Administered 2014-05-09 (×2): 1 mg via INTRAVENOUS
  Administered 2014-05-10 (×2): 2 mg via INTRAVENOUS
  Filled 2014-05-08 (×2): qty 1
  Filled 2014-05-08 (×4): qty 2
  Filled 2014-05-08: qty 1
  Filled 2014-05-08: qty 2
  Filled 2014-05-08 (×4): qty 1

## 2014-05-08 MED ORDER — HYDROMORPHONE HCL PF 1 MG/ML IJ SOLN
1.0000 mg | INTRAMUSCULAR | Status: DC | PRN
  Administered 2014-05-08 (×5): 1 mg via INTRAVENOUS
  Filled 2014-05-08 (×5): qty 1

## 2014-05-08 MED ORDER — HYDROMORPHONE HCL PF 1 MG/ML IJ SOLN
1.0000 mg | INTRAMUSCULAR | Status: DC | PRN
  Administered 2014-05-08: 1 mg via INTRAVENOUS

## 2014-05-08 MED ORDER — PANTOPRAZOLE SODIUM 40 MG PO TBEC
40.0000 mg | DELAYED_RELEASE_TABLET | Freq: Every day | ORAL | Status: DC
  Administered 2014-05-08 – 2014-05-10 (×3): 40 mg via ORAL
  Filled 2014-05-08 (×3): qty 1

## 2014-05-08 NOTE — Progress Notes (Signed)
Patient ID: Aaron Rodgers, male   DOB: 10/31/1960, 53 y.o.   MRN: [id number] [facility] Progress Note:   * No surgery found *  Subjective: Mental status is clear.  Has some underlying anxiety issues Objective: Vital signs in last 24 hours: Temp:  [97.9 F (36.6 C)-99 F (37.2 C)] 98.3 F (36.8 C) (08/02 0430) Pulse Rate:  [82-94] 94 (08/02 0430) Resp:  [16-18] 18 (08/02 0430) BP: (113-152)/(65-83) 139/83 mmHg (08/02 0430) SpO2:  [94 %-100 %] 97 % (08/02 0430) Weight:  [223 lb (101.152 kg)] 223 lb (101.152 kg) (08/01 1458)  Intake/Output from previous day: 08/01 0701 - 08/02 0700 In: 2331.3 [I.V.:1931.3; IV Piggyback:400] Out: 975 [Urine:975] Intake/Output this shift:    Physical Exam: Work of breathing is normal.  Left flank pain and some lower abdominal pain.  VSS and WBC normal.    Lab Results:  Results for orders placed during the hospital encounter of 05/07/14 (from the past 48 hour(s))  CBC WITH DIFFERENTIAL     Status: Abnormal   Collection Time    05/07/14 10:04 AM      Result Value Ref Range   WBC 8.8  4.0 - 10.5 K/uL   RBC 5.23  4.22 - 5.81 MIL/uL   Hemoglobin 16.8  13.0 - 17.0 g/dL   HCT 48.2  39.0 - 52.0 %   MCV 92.2  78.0 - 100.0 fL   MCH 32.1  26.0 - 34.0 pg   MCHC 34.9  30.0 - 36.0 g/dL   RDW 13.0  11.5 - 15.5 %   Platelets 173  150 - 400 K/uL   Neutrophils Relative % 77  43 - 77 %   Neutro Abs 6.8  1.7 - 7.7 K/uL   Lymphocytes Relative 11 (*) 12 - 46 %   Lymphs Abs 0.9  0.7 - 4.0 K/uL   Monocytes Relative 12  3 - 12 %   Monocytes Absolute 1.0  0.1 - 1.0 K/uL   Eosinophils Relative 0  0 - 5 %   Eosinophils Absolute 0.0  0.0 - 0.7 K/uL   Basophils Relative 0  0 - 1 %   Basophils Absolute 0.0  0.0 - 0.1 K/uL  COMPREHENSIVE METABOLIC PANEL     Status: Abnormal   Collection Time    05/07/14 10:04 AM      Result Value Ref Range   Sodium 135 (*) 137 - 147 mEq/L   Potassium 3.6 (*) 3.7 - 5.3 mEq/L   Chloride 96  96 - 112 mEq/L   CO2 24  19  - 32 mEq/L   Glucose, Bld 137 (*) 70 - 99 mg/dL   BUN 11  6 - 23 mg/dL   Creatinine, Ser 0.75  0.50 - 1.35 mg/dL   Calcium 9.5  8.4 - 10.5 mg/dL   Total Protein 7.8  6.0 - 8.3 g/dL   Albumin 3.8  3.5 - 5.2 g/dL   AST 22  0 - 37 U/L   ALT 33  0 - 53 U/L   Alkaline Phosphatase 68  39 - 117 U/L   Total Bilirubin 0.7  0.3 - 1.2 mg/dL   GFR calc non Af Amer >90  >90 mL/min   GFR calc Af Amer >90  >90 mL/min   Comment: (NOTE)     The eGFR has been calculated using the CKD EPI equation.     This calculation has not been validated in all clinical situations.     eGFR's persistently <90 mL/min  signify possible Chronic Kidney     Disease.   Anion gap 15  5 - 15  LIPASE, BLOOD     Status: None   Collection Time    05/07/14 10:04 AM      Result Value Ref Range   Lipase 14  11 - 59 U/L  URINALYSIS, ROUTINE W REFLEX MICROSCOPIC     Status: None   Collection Time    05/07/14 10:44 AM      Result Value Ref Range   Color, Urine YELLOW  YELLOW   APPearance CLEAR  CLEAR   Specific Gravity, Urine 1.013  1.005 - 1.030   pH 7.0  5.0 - 8.0   Glucose, UA NEGATIVE  NEGATIVE mg/dL   Hgb urine dipstick NEGATIVE  NEGATIVE   Bilirubin Urine NEGATIVE  NEGATIVE   Ketones, ur NEGATIVE  NEGATIVE mg/dL   Protein, ur NEGATIVE  NEGATIVE mg/dL   Urobilinogen, UA 1.0  0.0 - 1.0 mg/dL   Nitrite NEGATIVE  NEGATIVE   Leukocytes, UA NEGATIVE  NEGATIVE   Comment: MICROSCOPIC NOT DONE ON URINES WITH NEGATIVE PROTEIN, BLOOD, LEUKOCYTES, NITRITE, OR GLUCOSE <1000 mg/dL.  URINE RAPID DRUG SCREEN (HOSP PERFORMED)     Status: Abnormal   Collection Time    05/08/14  1:09 AM      Result Value Ref Range   Opiates POSITIVE (*) NONE DETECTED   Cocaine NONE DETECTED  NONE DETECTED   Benzodiazepines NONE DETECTED  NONE DETECTED   Amphetamines NONE DETECTED  NONE DETECTED   Tetrahydrocannabinol POSITIVE (*) NONE DETECTED   Barbiturates NONE DETECTED  NONE DETECTED   Comment:            DRUG SCREEN FOR MEDICAL PURPOSES      ONLY.  IF CONFIRMATION IS NEEDED     FOR ANY PURPOSE, NOTIFY LAB     WITHIN 5 DAYS.                LOWEST DETECTABLE LIMITS     FOR URINE DRUG SCREEN     Drug Class       Cutoff (ng/mL)     Amphetamine      1000     Barbiturate      200     Benzodiazepine   109     Tricyclics       323     Opiates          300     Cocaine          300     THC              50  COMPREHENSIVE METABOLIC PANEL     Status: Abnormal   Collection Time    05/08/14  5:25 AM      Result Value Ref Range   Sodium 137  137 - 147 mEq/L   Potassium 3.4 (*) 3.7 - 5.3 mEq/L   Chloride 98  96 - 112 mEq/L   CO2 28  19 - 32 mEq/L   Glucose, Bld 108 (*) 70 - 99 mg/dL   BUN 6  6 - 23 mg/dL   Creatinine, Ser 0.80  0.50 - 1.35 mg/dL   Calcium 8.6  8.4 - 10.5 mg/dL   Total Protein 7.1  6.0 - 8.3 g/dL   Albumin 3.1 (*) 3.5 - 5.2 g/dL   AST 99 (*) 0 - 37 U/L   ALT 106 (*) 0 - 53 U/L   Alkaline Phosphatase 97  39 - 117 U/L   Total Bilirubin 1.2  0.3 - 1.2 mg/dL   GFR calc non Af Amer >90  >90 mL/min   GFR calc Af Amer >90  >90 mL/min   Comment: (NOTE)     The eGFR has been calculated using the CKD EPI equation.     This calculation has not been validated in all clinical situations.     eGFR's persistently <90 mL/min signify possible Chronic Kidney     Disease.   Anion gap 11  5 - 15  CBC     Status: Abnormal   Collection Time    05/08/14  5:25 AM      Result Value Ref Range   WBC 5.3  4.0 - 10.5 K/uL   RBC 4.82  4.22 - 5.81 MIL/uL   Hemoglobin 15.6  13.0 - 17.0 g/dL   HCT 44.5  39.0 - 52.0 %   MCV 92.3  78.0 - 100.0 fL   MCH 32.4  26.0 - 34.0 pg   MCHC 35.1  30.0 - 36.0 g/dL   RDW 12.7  11.5 - 15.5 %   Platelets 145 (*) 150 - 400 K/uL    Radiology/Results: Ct Abdomen Pelvis Wo Contrast  05/07/2014   CLINICAL DATA:  Left flank pain  EXAM: CT ABDOMEN AND PELVIS WITHOUT CONTRAST  TECHNIQUE: Multidetector CT imaging of the abdomen and pelvis was performed following the standard protocol without IV contrast.   COMPARISON:  Prior CT abdomen/ pelvis 03/16/2014  FINDINGS: Lower Chest: Dependent atelectasis in the lower lobes. The visualized heart is within normal limits for size. Unremarkable distal thoracic esophagus.  Abdomen: Unenhanced CT was performed per clinician order. Lack of IV contrast limits sensitivity and specificity, especially for evaluation of abdominal/pelvic solid viscera. Within these limitations, unremarkable CT appearance of the stomach, duodenum, spleen, adrenal glands. Diffuse hypoattenuation of the hepatic parenchyma consistent with steatosis. No discrete hepatic lesion. Slight hypertrophy of the caudate and left hepatic lobe concerning for early cirrhotic change. The gallbladder is surgically absent. No intra or extrahepatic biliary ductal dilatation.  Unremarkable appearance of the bilateral kidneys. No focal solid lesion, hydronephrosis or nephrolithiasis.  Surgical changes of prior left hemicolectomy with patent colocolonic anastomosis. Focal bowel wall thickening involving loops of the jejunum in the left upper quadrant. There is associated interstitial stranding in the jejunal mesentery and thickening of the anterior para renal fascia and lateral Conal fascia. There is a single protrusion of air in gas which may represent a jejunal diverticulum. However, contained perforation is difficult to exclude in the absence of intravenous and oral contrast no definite diverticulum was identified in this region on 03/16/2014. Inflammatory change extends inferiorly along the retroperitoneal fat of the left pericolic gutter into the anatomic pelvis.  The appendix is surgically absent. The terminal ileum is unremarkable. No focal colonic thickening. No suspicious adenopathy. Stable appearance of anterior abdominal wall ventral hernias containing fat and loops of decompressed small bowel.  Pelvis: Unremarkable bladder, prostate gland and seminal vesicles. No free fluid or suspicious adenopathy.  Bones/Soft  Tissues: No acute fracture or aggressive appearing lytic or blastic osseous lesion.  Vascular: Limited evaluation in the absence of intravenous contrast. No aneurysmal dilatation.  IMPRESSION: 1. Focal inflammation of the loops of proximal jejunum in the left hemi abdomen with adjacent inflammatory stranding and thickening of the posterior pararenal and lateral conal fascia extending inferiorly into the pelvic sidewall. There is a focal collection of flocculent material along the mesenteric border of the  abnormal segment of small bowel concerning for contained small bowel perforation. A jejunal diverticulum is a possibility, however no such abnormality was identified on the recent prior CT scan from 03/16/2014. Recommend surgical consultation. 2. Left hemicolectomy with patent colocolonic anastomosis. 3. Advanced hepatic steatosis. Relative hypertrophy of the left hepatic lobe and caudate suggests early cirrhotic change. 4. Additional ancillary findings as above without significant interval change.   Electronically Signed   By: Jacqulynn Cadet M.D.   On: 05/07/2014 11:29    Anti-infectives: Anti-infectives   Start     Dose/Rate Route Frequency Ordered Stop   05/08/14 0000  ciprofloxacin (CIPRO) IVPB 400 mg     400 mg 200 mL/hr over 60 Minutes Intravenous Every 12 hours 05/07/14 1501     05/07/14 2200  metroNIDAZOLE (FLAGYL) IVPB 500 mg     500 mg 100 mL/hr over 60 Minutes Intravenous Every 8 hours 05/07/14 1504     05/07/14 1330  ciprofloxacin (CIPRO) IVPB 400 mg  Status:  Discontinued     400 mg 200 mL/hr over 60 Minutes Intravenous Every 12 hours 05/07/14 1328 05/07/14 1501   05/07/14 1330  metroNIDAZOLE (FLAGYL) IVPB 500 mg  Status:  Discontinued     500 mg 100 mL/hr over 60 Minutes Intravenous Every 8 hours 05/07/14 1328 05/07/14 1504   05/07/14 1200  ciprofloxacin (CIPRO) IVPB 400 mg     400 mg 200 mL/hr over 60 Minutes Intravenous  Once 05/07/14 1146 05/07/14 1313   05/07/14 1200   metroNIDAZOLE (FLAGYL) IVPB 500 mg     500 mg 100 mL/hr over 60 Minutes Intravenous  Once 05/07/14 1146 05/07/14 1443      Assessment/Plan: Problem List: Patient Active Problem List   Diagnosis Date Noted  . Bowel perforation 05/07/2014    Will address anxiety issues with Xanax.  Start clear liquids.   * No surgery found *    LOS: 1 day   Matt B. Hassell Done, MD, [facility] [phone] beeper [phone]  05/08/2014 9:26 AM

## 2014-05-08 NOTE — Progress Notes (Signed)
[facility] PROGRESS NOTE  Aaron BoschLawarren Rodgers WJX:[id number] DOB: 1960/10/17 DOA: 05/07/2014 PCP: Dorrene GermanAVBUERE,EDWIN A, MD  Assessment/Plan: 1. Presumably bowel perforation: At this moment plan is to continue holding of process with IV antibiotics and supportive care. -Follow surgery recommendations diet is going to be advanced to clear liquids and further decisions will be made based on clinical response -Patient continued to experience significant pain in his left abdominal quadrant -Continue pain medications as needed -Will transfer care to [facility]  2. Hypertension: Stable and well controlled. -Continue current medication regimen. -In the circumstances the patient becomes n.p.o. Or require surgery will recommend the use of hydralazine and metoprolol IV to control blood pressure -Please feel free to consult internal medicine for any further needs in the near future.  3. tobacco abuse: Cessation counseling has been provided a nicotine patch has been ordered  4. anxiety/panic attacks: Agree with the use of Xanax as needed  5. Presumed COPD: No formal PFT's has been made. Patient is actively smoking -good O2 sat on RA, no wheezing and was just using PRN albuterol inhaler therapy at home -continue PRN  albuterol  -tobacco cessation andcounseling provided   6. GERD:  continue PPI   Code Status: Full Family Communication: Wife over the phone Disposition Plan: Patient will be transfer to general surgery service for final decisions and treatment regarding presumably bowel perforation  Procedures:   see x-ray reports below  Antibiotics:  Ciprofloxacin  Flagyl  HPI/Subjective: Patient is still complaining of significant pain on his left abdominal quadrant; no fever, no nausea, no vomiting.   Objective: Filed Vitals:   05/08/14 0430  BP: 139/83  Pulse: 94  Temp: 98.3 F (36.8 C)  Resp: 18    Intake/Output Summary (Last 24 hours) at 05/08/14 1405 Last data filed at 05/08/14  1000  Gross per 24 hour  Intake 2691.25 ml  Output    575 ml  Net 2116.25 ml   Filed Weights   05/07/14 1458  Weight: 101.152 kg (223 lb)    Exam:   General:  Alert, awake and oriented x3; mildly anxious; no fever.  Cardiovascular: S1 and S2 appreciated on exam; no rubs or gallops  Respiratory: Clear to auscultation bilaterally  Abdomen: no guarding, left lower quadrant pain on exam; positive bowel sounds  Musculoskeletal: no clubbing, no erythema, no edema  Data Reviewed: Basic Metabolic Panel:  Recent Labs Lab 05/07/14 1004 05/08/14 0525  NA 135* 137  K 3.6* 3.4*  CL 96 98  CO2 24 28  GLUCOSE 137* 108*  BUN 11 6  CREATININE 0.75 0.80  CALCIUM 9.5 8.6   Liver Function Tests:  Recent Labs Lab 05/07/14 1004 05/08/14 0525  AST 22 99*  ALT 33 106*  ALKPHOS 68 97  BILITOT 0.7 1.2  PROT 7.8 7.1  ALBUMIN 3.8 3.1*    Recent Labs Lab 05/07/14 1004  LIPASE 14   CBC:  Recent Labs Lab 05/07/14 1004 05/08/14 0525  WBC 8.8 5.3  NEUTROABS 6.8  --   HGB 16.8 15.6  HCT 48.2 44.5  MCV 92.2 92.3  PLT 173 145*   Cardiac Enzymes: No results found for this basename: CKTOTAL, CKMB, CKMBINDEX, TROPONINI,  in the last 168 hours BNP (last 3 results) No results found for this basename: PROBNP,  in the last 8760 hours CBG: No results found for this basename: GLUCAP,  in the last 168 hours  No results found for this or any previous visit (from the past 240 hour(s)).   Studies:  Ct Abdomen Pelvis Wo Contrast  05/07/2014   CLINICAL DATA:  Left flank pain  EXAM: CT ABDOMEN AND PELVIS WITHOUT CONTRAST  TECHNIQUE: Multidetector CT imaging of the abdomen and pelvis was performed following the standard protocol without IV contrast.  COMPARISON:  Prior CT abdomen/ pelvis 03/16/2014  FINDINGS: Lower Chest: Dependent atelectasis in the lower lobes. The visualized heart is within normal limits for size. Unremarkable distal thoracic esophagus.  Abdomen: Unenhanced CT was  performed per clinician order. Lack of IV contrast limits sensitivity and specificity, especially for evaluation of abdominal/pelvic solid viscera. Within these limitations, unremarkable CT appearance of the stomach, duodenum, spleen, adrenal glands. Diffuse hypoattenuation of the hepatic parenchyma consistent with steatosis. No discrete hepatic lesion. Slight hypertrophy of the caudate and left hepatic lobe concerning for early cirrhotic change. The gallbladder is surgically absent. No intra or extrahepatic biliary ductal dilatation.  Unremarkable appearance of the bilateral kidneys. No focal solid lesion, hydronephrosis or nephrolithiasis.  Surgical changes of prior left hemicolectomy with patent colocolonic anastomosis. Focal bowel wall thickening involving loops of the jejunum in the left upper quadrant. There is associated interstitial stranding in the jejunal mesentery and thickening of the anterior para renal fascia and lateral Conal fascia. There is a single protrusion of air in gas which may represent a jejunal diverticulum. However, contained perforation is difficult to exclude in the absence of intravenous and oral contrast no definite diverticulum was identified in this region on 03/16/2014. Inflammatory change extends inferiorly along the retroperitoneal fat of the left pericolic gutter into the anatomic pelvis.  The appendix is surgically absent. The terminal ileum is unremarkable. No focal colonic thickening. No suspicious adenopathy. Stable appearance of anterior abdominal wall ventral hernias containing fat and loops of decompressed small bowel.  Pelvis: Unremarkable bladder, prostate gland and seminal vesicles. No free fluid or suspicious adenopathy.  Bones/Soft Tissues: No acute fracture or aggressive appearing lytic or blastic osseous lesion.  Vascular: Limited evaluation in the absence of intravenous contrast. No aneurysmal dilatation.  IMPRESSION: 1. Focal inflammation of the loops of proximal  jejunum in the left hemi abdomen with adjacent inflammatory stranding and thickening of the posterior pararenal and lateral conal fascia extending inferiorly into the pelvic sidewall. There is a focal collection of flocculent material along the mesenteric border of the abnormal segment of small bowel concerning for contained small bowel perforation. A jejunal diverticulum is a possibility, however no such abnormality was identified on the recent prior CT scan from 03/16/2014. Recommend surgical consultation. 2. Left hemicolectomy with patent colocolonic anastomosis. 3. Advanced hepatic steatosis. Relative hypertrophy of the left hepatic lobe and caudate suggests early cirrhotic change. 4. Additional ancillary findings as above without significant interval change.   Electronically Signed   By: Malachy Moan M.D.   On: 05/07/2014 11:29    Scheduled Meds: . sodium chloride   Intravenous STAT  . amLODipine  5 mg Oral Daily  . ciprofloxacin  400 mg Intravenous Q12H  . enoxaparin (LOVENOX) injection  40 mg Subcutaneous Q24H  . hydrochlorothiazide  25 mg Oral Daily  . lisinopril  20 mg Oral Daily  . metronidazole  500 mg Intravenous Q8H  . nicotine  14 mg Transdermal Daily   Continuous Infusions: . sodium chloride 125 mL/hr at 05/08/14 1100    Active Problems:   Bowel perforation    Time spent: <30 minutes   Vassie Loll  [facility] Pager [phone]. If 7PM-7AM, please contact night-coverage at [email or web address], password [facility] 05/08/2014, 2:05 PM  LOS: 1  day

## 2014-05-09 DIAGNOSIS — K631 Perforation of intestine (nontraumatic): Secondary | ICD-10-CM

## 2014-05-09 LAB — CBC WITH DIFFERENTIAL/PLATELET
BASOS PCT: 0 % (ref 0–1)
Basophils Absolute: 0 10*3/uL (ref 0.0–0.1)
Eosinophils Absolute: 0 10*3/uL (ref 0.0–0.7)
Eosinophils Relative: 0 % (ref 0–5)
HEMATOCRIT: 45.4 % (ref 39.0–52.0)
Hemoglobin: 15.8 g/dL (ref 13.0–17.0)
Lymphocytes Relative: 26 % (ref 12–46)
Lymphs Abs: 1.5 10*3/uL (ref 0.7–4.0)
MCH: 32.4 pg (ref 26.0–34.0)
MCHC: 34.8 g/dL (ref 30.0–36.0)
MCV: 93 fL (ref 78.0–100.0)
Monocytes Absolute: 0.8 10*3/uL (ref 0.1–1.0)
Monocytes Relative: 14 % — ABNORMAL HIGH (ref 3–12)
NEUTROS ABS: 3.4 10*3/uL (ref 1.7–7.7)
Neutrophils Relative %: 60 % (ref 43–77)
Platelets: 143 10*3/uL — ABNORMAL LOW (ref 150–400)
RBC: 4.88 MIL/uL (ref 4.22–5.81)
RDW: 12.5 % (ref 11.5–15.5)
WBC: 5.8 10*3/uL (ref 4.0–10.5)

## 2014-05-09 MED ORDER — ACETAMINOPHEN 650 MG RE SUPP: 650.0000 mg | Freq: Four times a day (QID) | RECTAL | Status: DC | PRN

## 2014-05-09 MED ORDER — BISACODYL 10 MG RE SUPP: 10.0000 mg | Freq: Two times a day (BID) | RECTAL | Status: DC | PRN

## 2014-05-09 MED ORDER — HYDROCODONE-ACETAMINOPHEN 5-325 MG PO TABS
1.0000 | ORAL_TABLET | ORAL | Status: DC | PRN
  Administered 2014-05-09 – 2014-05-10 (×4): 2 via ORAL
  Filled 2014-05-09: qty 1
  Filled 2014-05-09 (×3): qty 2
  Filled 2014-05-09: qty 1

## 2014-05-09 MED ORDER — SACCHAROMYCES BOULARDII 250 MG PO CAPS
250.0000 mg | ORAL_CAPSULE | Freq: Two times a day (BID) | ORAL | Status: DC
  Administered 2014-05-09 – 2014-05-10 (×3): 250 mg via ORAL
  Filled 2014-05-09 (×4): qty 1
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
## 2014-05-10 LAB — CBC
HCT: 47.7 % (ref 39.0–52.0)
Hemoglobin: 16.5 g/dL (ref 13.0–17.0)
MCH: 32.1 pg (ref 26.0–34.0)
MCHC: 34.6 g/dL (ref 30.0–36.0)
MCV: 92.8 fL (ref 78.0–100.0)
Platelets: 165 K/uL (ref 150–400)
RBC: 5.14 MIL/uL (ref 4.22–5.81)
RDW: 12.4 % (ref 11.5–15.5)
WBC: 5 K/uL (ref 4.0–10.5)

## 2014-05-10 MED ORDER — CIPROFLOXACIN HCL 500 MG PO TABS: 500.0000 mg | ORAL_TABLET | Freq: Two times a day (BID) | ORAL | Status: DC

## 2014-05-10 MED ORDER — METRONIDAZOLE 500 MG PO TABS: 500.0000 mg | ORAL_TABLET | Freq: Three times a day (TID) | ORAL | Status: DC

## 2014-05-10 MED ORDER — HYDROCODONE-ACETAMINOPHEN 5-325 MG PO TABS: 1.0000 | ORAL_TABLET | Freq: Four times a day (QID) | ORAL | Status: DC | PRN

## 2014-05-10 MED ORDER — HYDROMORPHONE HCL PF 1 MG/ML IJ SOLN
0.5000 mg | INTRAMUSCULAR | Status: DC | PRN
  Administered 2014-05-10: 2 mg via INTRAVENOUS
  Filled 2014-05-10: qty 2
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
## 2014-05-10 NOTE — Discharge Instructions (Signed)
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
## 2014-10-10 ENCOUNTER — Ambulatory Visit: Payer: Self-pay | Attending: Internal Medicine

## 2016-03-12 ENCOUNTER — Emergency Department (HOSPITAL_COMMUNITY): Payer: Self-pay

## 2016-03-12 ENCOUNTER — Emergency Department (HOSPITAL_COMMUNITY)
Admission: EM | Admit: 2016-03-12 | Discharge: 2016-03-12 | Disposition: A | Discharge status: Home / Self Care | Payer: Self-pay | Attending: Emergency Medicine | Admitting: Emergency Medicine

## 2016-03-12 ENCOUNTER — Encounter (HOSPITAL_COMMUNITY): Payer: Self-pay | Admitting: Emergency Medicine

## 2016-03-12 DIAGNOSIS — R1084 Generalized abdominal pain: Secondary | ICD-10-CM | POA: Insufficient documentation

## 2016-03-12 DIAGNOSIS — Z791 Long term (current) use of non-steroidal anti-inflammatories (NSAID): Secondary | ICD-10-CM | POA: Insufficient documentation

## 2016-03-12 DIAGNOSIS — R112 Nausea with vomiting, unspecified: Secondary | ICD-10-CM | POA: Insufficient documentation

## 2016-03-12 DIAGNOSIS — F17210 Nicotine dependence, cigarettes, uncomplicated: Secondary | ICD-10-CM | POA: Insufficient documentation

## 2016-03-12 DIAGNOSIS — J449 Chronic obstructive pulmonary disease, unspecified: Secondary | ICD-10-CM | POA: Insufficient documentation

## 2016-03-12 DIAGNOSIS — Z79891 Long term (current) use of opiate analgesic: Secondary | ICD-10-CM | POA: Insufficient documentation

## 2016-03-12 DIAGNOSIS — J189 Pneumonia, unspecified organism: Secondary | ICD-10-CM | POA: Insufficient documentation

## 2016-03-12 DIAGNOSIS — R197 Diarrhea, unspecified: Secondary | ICD-10-CM

## 2016-03-12 DIAGNOSIS — E876 Hypokalemia: Secondary | ICD-10-CM | POA: Insufficient documentation

## 2016-03-12 DIAGNOSIS — Z792 Long term (current) use of antibiotics: Secondary | ICD-10-CM | POA: Insufficient documentation

## 2016-03-12 DIAGNOSIS — I10 Essential (primary) hypertension: Secondary | ICD-10-CM | POA: Insufficient documentation

## 2016-03-12 DIAGNOSIS — Z79899 Other long term (current) drug therapy: Secondary | ICD-10-CM | POA: Insufficient documentation

## 2016-03-12 LAB — COMPREHENSIVE METABOLIC PANEL
ALK PHOS: 70 U/L (ref 38–126)
ALT: 42 U/L (ref 17–63)
ANION GAP: 10 (ref 5–15)
AST: 24 U/L (ref 15–41)
Albumin: 4.2 g/dL (ref 3.5–5.0)
BUN: 10 mg/dL (ref 6–20)
CALCIUM: 9 mg/dL (ref 8.9–10.3)
CO2: 30 mmol/L (ref 22–32)
Chloride: 93 mmol/L — ABNORMAL LOW (ref 101–111)
Creatinine, Ser: 0.91 mg/dL (ref 0.61–1.24)
GFR calc non Af Amer: >60 mL/min (ref >60)
Glucose, Bld: 125 mg/dL — ABNORMAL HIGH (ref 65–99)
Potassium: 2.9 mmol/L — ABNORMAL LOW (ref 3.5–5.1)
Sodium: 133 mmol/L — ABNORMAL LOW (ref 135–145)
Total Bilirubin: 1.2 mg/dL (ref 0.3–1.2)
Total Protein: 8 g/dL (ref 6.5–8.1)

## 2016-03-12 LAB — CBC WITH DIFFERENTIAL/PLATELET
Basophils Absolute: 0 10*3/uL (ref 0.0–0.1)
Basophils Relative: 0 %
EOS PCT: 1 %
Eosinophils Absolute: 0 10*3/uL (ref 0.0–0.7)
HCT: 53.3 % — ABNORMAL HIGH (ref 39.0–52.0)
Hemoglobin: 19.4 g/dL — ABNORMAL HIGH (ref 13.0–17.0)
LYMPHS ABS: 0.8 10*3/uL (ref 0.7–4.0)
Lymphocytes Relative: 13 %
MCH: 33 pg (ref 26.0–34.0)
MCHC: 36.4 g/dL — AB (ref 30.0–36.0)
MCV: 90.6 fL (ref 78.0–100.0)
MONOS PCT: 12 %
Monocytes Absolute: 0.7 10*3/uL (ref 0.1–1.0)
Neutro Abs: 4.5 10*3/uL (ref 1.7–7.7)
Neutrophils Relative %: 74 %
PLATELETS: 147 10*3/uL — AB (ref 150–400)
RBC: 5.88 MIL/uL — ABNORMAL HIGH (ref 4.22–5.81)
RDW: 13.2 % (ref 11.5–15.5)
WBC: 6 10*3/uL (ref 4.0–10.5)

## 2016-03-12 LAB — URINALYSIS, ROUTINE W REFLEX MICROSCOPIC
BILIRUBIN URINE: NEGATIVE
Glucose, UA: NEGATIVE mg/dL
Hgb urine dipstick: NEGATIVE
KETONES UR: NEGATIVE mg/dL
Leukocytes, UA: NEGATIVE
NITRITE: NEGATIVE
Protein, ur: NEGATIVE mg/dL
Specific Gravity, Urine: 1.024 (ref 1.005–1.030)
pH: 7 (ref 5.0–8.0)

## 2016-03-12 LAB — LIPASE, BLOOD: LIPASE: 25 U/L (ref 11–51)

## 2016-03-12 LAB — I-STAT TROPONIN, ED: TROPONIN I, POC: 0 ng/mL (ref 0.00–0.08)

## 2016-03-12 MED ORDER — IOPAMIDOL (ISOVUE-300) INJECTION 61%
100.0000 mL | Freq: Once | INTRAVENOUS | Status: AC | PRN
  Administered 2016-03-12: 100 mL via INTRAVENOUS

## 2016-03-12 MED ORDER — POTASSIUM CHLORIDE CRYS ER 20 MEQ PO TBCR
40.0000 meq | EXTENDED_RELEASE_TABLET | Freq: Once | ORAL | Status: AC
  Administered 2016-03-12: 40 meq via ORAL
  Filled 2016-03-12: qty 2

## 2016-03-12 MED ORDER — ONDANSETRON HCL 4 MG/2ML IJ SOLN
4.0000 mg | Freq: Once | INTRAMUSCULAR | Status: AC
  Administered 2016-03-12: 4 mg via INTRAVENOUS
  Filled 2016-03-12: qty 2

## 2016-03-12 MED ORDER — LOPERAMIDE HCL 2 MG PO CAPS: 2.0000 mg | ORAL_CAPSULE | Freq: Four times a day (QID) | ORAL | Status: DC | PRN

## 2016-03-12 MED ORDER — POTASSIUM CHLORIDE ER 10 MEQ PO TBCR: 10.0000 meq | EXTENDED_RELEASE_TABLET | Freq: Two times a day (BID) | ORAL | Status: DC

## 2016-03-12 MED ORDER — SODIUM CHLORIDE 0.9 % IV BOLUS (SEPSIS)
1000.0000 mL | Freq: Once | INTRAVENOUS | Status: AC
  Administered 2016-03-12: 1000 mL via INTRAVENOUS

## 2016-03-12 MED ORDER — POTASSIUM CHLORIDE 10 MEQ/100ML IV SOLN
10.0000 meq | INTRAVENOUS | Status: AC
  Administered 2016-03-12 (×2): 10 meq via INTRAVENOUS
  Filled 2016-03-12 (×2): qty 100

## 2016-03-12 MED ORDER — HYDROMORPHONE HCL 1 MG/ML IJ SOLN
0.5000 mg | Freq: Once | INTRAMUSCULAR | Status: AC
  Administered 2016-03-12: 0.5 mg via INTRAVENOUS
  Filled 2016-03-12: qty 1

## 2016-03-12 MED ORDER — AZITHROMYCIN 250 MG PO TABS: ORAL_TABLET | ORAL | Status: DC

## 2016-03-12 MED ORDER — ONDANSETRON 4 MG PO TBDP: 4.0000 mg | ORAL_TABLET | Freq: Three times a day (TID) | ORAL | Status: DC | PRN

## 2016-03-12 MED ORDER — SODIUM CHLORIDE 0.9 % IV SOLN
INTRAVENOUS | Status: DC
  Administered 2016-03-12: 12:00:00 via INTRAVENOUS
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
## 2016-03-12 NOTE — ED Notes (Signed)
Patient transported to CT 

## 2016-03-12 NOTE — ED Notes (Signed)
Patient c/o left sided chest pain x 3 days, as well as mid abd pain with nausea and vomited once this am.

## 2016-03-12 NOTE — ED Notes (Signed)
Patient transported to X-ray 

## 2016-03-12 NOTE — ED Notes (Signed)
Pt have been made aware of urine sample °

## 2016-03-12 NOTE — Discharge Instructions (Signed)
Please read and follow all provided instructions.  Your diagnoses today include:  1. Generalized abdominal pain   2. Nausea vomiting and diarrhea   3. Hypokalemia   4. Community acquired pneumonia     Tests performed today include:  Blood counts and electrolytes - shows low potassium  Blood tests to check liver and kidney function  Blood tests to check pancreas function  Urine test to look for infection and pregnancy (in women)  CT scan - no major problems, shows small hernias  Vital signs. See below for your results today.   Medications prescribed:   Zofran (ondansetron) - for nausea and vomiting   Imodium - medication for diarrhea   Azithromycin - antibiotic for respiratory infection  You have been prescribed an antibiotic medicine: take the entire course of medicine even if you are feeling better. Stopping early can cause the antibiotic not to work.  Take any prescribed medications only as directed.  Home care instructions:   Follow any educational materials contained in this packet.  Follow-up instructions: Please follow-up with your primary care provider in the next 2 days for further evaluation of your symptoms.    Return instructions:  SEEK IMMEDIATE MEDICAL ATTENTION IF:  The pain does not go away or becomes severe   A temperature above 101F develops   Repeated vomiting occurs (multiple episodes)   Worsening shortness of breath or trouble breathing.  The pain becomes localized to portions of the abdomen. The right side could possibly be appendicitis. In an adult, the left lower portion of the abdomen could be colitis or diverticulitis.   Blood is being passed in stools or vomit (bright red or black tarry stools)   You develop chest pain, difficulty breathing, dizziness or fainting, or become confused, poorly responsive, or inconsolable (young children)  If you have any other emergent concerns regarding your health  Additional  Information: Abdominal (belly) pain can be caused by many things. Your caregiver performed an examination and possibly ordered blood/urine tests and imaging (CT scan, x-rays, ultrasound). Many cases can be observed and treated at home after initial evaluation in the emergency department. Even though you are being discharged home, abdominal pain can be unpredictable. Therefore, you need a repeated exam if your pain does not resolve, returns, or worsens. Most patients with abdominal pain don't have to be admitted to the hospital or have surgery, but serious problems like appendicitis and gallbladder attacks can start out as nonspecific pain. Many abdominal conditions cannot be diagnosed in one visit, so follow-up evaluations are very important.  Your vital signs today were: BP 132/56 mmHg   Pulse 85   Temp(Src) 98.3 F (36.8 C) (Oral)   Resp 20   Ht 5\' 8"  (1.727 m)   Wt 100.699 kg   BMI 33.76 kg/m2   SpO2 93% If your blood pressure (bp) was elevated above 135/85 this visit, please have this repeated by your doctor within one month. --------------

## 2017-06-18 ENCOUNTER — Encounter (HOSPITAL_COMMUNITY): Payer: Self-pay

## 2017-06-18 ENCOUNTER — Emergency Department (HOSPITAL_COMMUNITY): Payer: Self-pay

## 2017-06-18 ENCOUNTER — Emergency Department (HOSPITAL_COMMUNITY)
Admission: EM | Admit: 2017-06-18 | Discharge: 2017-06-18 | Disposition: A | Discharge status: Home / Self Care | Payer: Self-pay | Attending: Emergency Medicine | Admitting: Emergency Medicine

## 2017-06-18 DIAGNOSIS — M79674 Pain in right toe(s): Secondary | ICD-10-CM

## 2017-06-18 DIAGNOSIS — B353 Tinea pedis: Secondary | ICD-10-CM | POA: Insufficient documentation

## 2017-06-18 DIAGNOSIS — J449 Chronic obstructive pulmonary disease, unspecified: Secondary | ICD-10-CM | POA: Insufficient documentation

## 2017-06-18 DIAGNOSIS — F17210 Nicotine dependence, cigarettes, uncomplicated: Secondary | ICD-10-CM | POA: Insufficient documentation

## 2017-06-18 DIAGNOSIS — Z79899 Other long term (current) drug therapy: Secondary | ICD-10-CM | POA: Insufficient documentation

## 2017-06-18 DIAGNOSIS — I10 Essential (primary) hypertension: Secondary | ICD-10-CM | POA: Insufficient documentation

## 2017-06-18 MED ORDER — CLOTRIMAZOLE 1 % EX CREA: TOPICAL_CREAM | CUTANEOUS | 0 refills | Status: DC

## 2017-06-18 NOTE — ED Triage Notes (Signed)
Patient c/o right fifth toe pain. Patient states pain is worse with weight bearing.

## 2017-06-18 NOTE — ED Notes (Signed)
Patient transported to X-ray 
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
## 2017-06-18 NOTE — Discharge Instructions (Signed)
Return to ER if any severe swelling, redness, worsening pain, or fevers.

## 2017-08-06 ENCOUNTER — Encounter (HOSPITAL_COMMUNITY): Payer: Self-pay

## 2017-08-06 ENCOUNTER — Emergency Department (HOSPITAL_COMMUNITY)
Admission: EM | Admit: 2017-08-06 | Discharge: 2017-08-06 | Disposition: A | Discharge status: Home / Self Care | Payer: Self-pay | Attending: Emergency Medicine | Admitting: Emergency Medicine

## 2017-08-06 DIAGNOSIS — J449 Chronic obstructive pulmonary disease, unspecified: Secondary | ICD-10-CM | POA: Insufficient documentation

## 2017-08-06 DIAGNOSIS — I10 Essential (primary) hypertension: Secondary | ICD-10-CM | POA: Insufficient documentation

## 2017-08-06 DIAGNOSIS — L03031 Cellulitis of right toe: Secondary | ICD-10-CM | POA: Insufficient documentation

## 2017-08-06 DIAGNOSIS — Z79899 Other long term (current) drug therapy: Secondary | ICD-10-CM | POA: Insufficient documentation

## 2017-08-06 DIAGNOSIS — F17210 Nicotine dependence, cigarettes, uncomplicated: Secondary | ICD-10-CM | POA: Insufficient documentation

## 2017-08-06 LAB — CBC WITH DIFFERENTIAL/PLATELET
BASOS PCT: 0 %
Basophils Absolute: 0 10*3/uL (ref 0.0–0.1)
Eosinophils Absolute: 0.1 10*3/uL (ref 0.0–0.7)
Eosinophils Relative: 2 %
HCT: 47.3 % (ref 39.0–52.0)
Hemoglobin: 17.3 g/dL — ABNORMAL HIGH (ref 13.0–17.0)
LYMPHS ABS: 1.7 10*3/uL (ref 0.7–4.0)
Lymphocytes Relative: 53 %
MCH: 34.5 pg — AB (ref 26.0–34.0)
MCHC: 36.6 g/dL — AB (ref 30.0–36.0)
MCV: 94.2 fL (ref 78.0–100.0)
MONOS PCT: 10 %
Monocytes Absolute: 0.3 10*3/uL (ref 0.1–1.0)
Neutro Abs: 1.1 10*3/uL — ABNORMAL LOW (ref 1.7–7.7)
Neutrophils Relative %: 35 %
PLATELETS: 152 10*3/uL (ref 150–400)
RBC: 5.02 MIL/uL (ref 4.22–5.81)
RDW: 12.9 % (ref 11.5–15.5)
WBC: 3.2 10*3/uL — ABNORMAL LOW (ref 4.0–10.5)

## 2017-08-06 LAB — BASIC METABOLIC PANEL
Anion gap: 12 (ref 5–15)
BUN: 13 mg/dL (ref 6–20)
CALCIUM: 9 mg/dL (ref 8.9–10.3)
CHLORIDE: 95 mmol/L — AB (ref 101–111)
CO2: 30 mmol/L (ref 22–32)
CREATININE: 0.79 mg/dL (ref 0.61–1.24)
GFR calc Af Amer: >60 mL/min (ref >60)
Glucose, Bld: 103 mg/dL — ABNORMAL HIGH (ref 65–99)
Potassium: 3 mmol/L — ABNORMAL LOW (ref 3.5–5.1)
SODIUM: 137 mmol/L (ref 135–145)

## 2017-08-06 MED ORDER — OXYCODONE-ACETAMINOPHEN 5-325 MG PO TABS
1.0000 | ORAL_TABLET | Freq: Once | ORAL | Status: AC
  Administered 2017-08-06: 1 via ORAL
  Filled 2017-08-06: qty 1

## 2017-08-06 MED ORDER — CLINDAMYCIN HCL 300 MG PO CAPS
300.0000 mg | ORAL_CAPSULE | Freq: Once | ORAL | Status: AC
  Administered 2017-08-06: 300 mg via ORAL
  Filled 2017-08-06: qty 1

## 2017-08-06 MED ORDER — DICLOFENAC SODIUM 50 MG PO TBEC: 50.0000 mg | DELAYED_RELEASE_TABLET | Freq: Two times a day (BID) | ORAL | 0 refills | Status: DC

## 2017-08-06 MED ORDER — HYDROCODONE-ACETAMINOPHEN 5-325 MG PO TABS: 1.0000 | ORAL_TABLET | Freq: Four times a day (QID) | ORAL | 0 refills | Status: DC | PRN

## 2017-08-06 MED ORDER — CLINDAMYCIN HCL 300 MG PO CAPS: 300.0000 mg | ORAL_CAPSULE | Freq: Three times a day (TID) | ORAL | 0 refills | Status: DC

## 2017-08-06 NOTE — Discharge Instructions (Signed)
Call Dr. Michel Harrow office to schedule follow up.

## 2017-08-06 NOTE — ED Triage Notes (Signed)
Pt complains of right foot infection for over a week and has been seen for it, He states that the area between his toes Is still infection and hurting
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
## 2017-09-22 ENCOUNTER — Ambulatory Visit: Payer: Self-pay | Admitting: Podiatry

## 2017-09-22 ENCOUNTER — Encounter: Payer: Self-pay | Admitting: Podiatry

## 2017-09-22 DIAGNOSIS — L84 Corns and callosities: Secondary | ICD-10-CM

## 2017-09-22 DIAGNOSIS — M2041 Other hammer toe(s) (acquired), right foot: Secondary | ICD-10-CM

## 2017-09-22 DIAGNOSIS — M779 Enthesopathy, unspecified: Secondary | ICD-10-CM

## 2017-09-22 NOTE — Progress Notes (Signed)
   Subjective:    Patient ID: Aaron Rodgers, male    DOB: [birth year], 56 y.o.   MRN: [id number]  HPI    Review of Systems  All other systems reviewed and are negative.      Objective:   Physical Exam        Assessment & Plan:

## 2017-09-23 NOTE — Progress Notes (Signed)
Subjective:   Patient ID: Aaron Rodgers, male   DOB: 56 y.o.   MRN: [id number]   HPI Got fluid in it and is painful with palpation patient presents with significant discomfort between the fourth and fifth toes right with keratotic lesion formation at   Review of Systems  All other systems reviewed and are negative.       Objective:  Physical Exam  Constitutional: He appears well-developed and well-nourished.  Cardiovascular: Intact distal pulses.  Pulmonary/Chest: Effort normal.  Musculoskeletal: Normal range of motion.  Neurological: He is alert.  Skin: Skin is warm.  Nursing note and vitals reviewed.   Neurovascular status intact with severe keratotic lesion fourth interspace right with thick thick tissue formation fluid buildup and pain to palpation     Assessment:  Lesions secondary to structural changes with digital rotation and keratotic lesion     Plan:  H&P condition reviewed proximal nerve block administered in sterile injection of steroid into the fourth interspace to reduce inflammation.  I then did deep debridement of the lesion we will see the response and decide if anything else would be appropriate for this patient

## 2017-09-24 ENCOUNTER — Ambulatory Visit: Payer: Self-pay | Admitting: Podiatry

## 2018-06-30 ENCOUNTER — Encounter (HOSPITAL_COMMUNITY): Payer: Self-pay

## 2018-06-30 ENCOUNTER — Emergency Department (HOSPITAL_COMMUNITY): Payer: Self-pay

## 2018-06-30 ENCOUNTER — Other Ambulatory Visit: Payer: Self-pay

## 2018-06-30 ENCOUNTER — Emergency Department (HOSPITAL_COMMUNITY)
Admission: EM | Admit: 2018-06-30 | Discharge: 2018-06-30 | Disposition: A | Discharge status: Home / Self Care | Payer: Self-pay | Attending: Emergency Medicine | Admitting: Emergency Medicine

## 2018-06-30 DIAGNOSIS — Z79899 Other long term (current) drug therapy: Secondary | ICD-10-CM | POA: Insufficient documentation

## 2018-06-30 DIAGNOSIS — J209 Acute bronchitis, unspecified: Secondary | ICD-10-CM | POA: Insufficient documentation

## 2018-06-30 DIAGNOSIS — F17210 Nicotine dependence, cigarettes, uncomplicated: Secondary | ICD-10-CM | POA: Insufficient documentation

## 2018-06-30 DIAGNOSIS — I10 Essential (primary) hypertension: Secondary | ICD-10-CM | POA: Insufficient documentation

## 2018-06-30 DIAGNOSIS — J449 Chronic obstructive pulmonary disease, unspecified: Secondary | ICD-10-CM | POA: Insufficient documentation

## 2018-06-30 LAB — BASIC METABOLIC PANEL
Anion gap: 13 (ref 5–15)
BUN: 7 mg/dL (ref 6–20)
CALCIUM: 9.7 mg/dL (ref 8.9–10.3)
CHLORIDE: 95 mmol/L — AB (ref 98–111)
CO2: 30 mmol/L (ref 22–32)
CREATININE: 0.72 mg/dL (ref 0.61–1.24)
Glucose, Bld: 121 mg/dL — ABNORMAL HIGH (ref 70–99)
Potassium: 3.2 mmol/L — ABNORMAL LOW (ref 3.5–5.1)
SODIUM: 138 mmol/L (ref 135–145)

## 2018-06-30 LAB — CBC
HCT: 53.7 % — ABNORMAL HIGH (ref 39.0–52.0)
Hemoglobin: 19.6 g/dL — ABNORMAL HIGH (ref 13.0–17.0)
MCH: 33.8 pg (ref 26.0–34.0)
MCHC: 36.5 g/dL — ABNORMAL HIGH (ref 30.0–36.0)
MCV: 92.6 fL (ref 78.0–100.0)
PLATELETS: 132 10*3/uL — AB (ref 150–400)
RBC: 5.8 MIL/uL (ref 4.22–5.81)
RDW: 12.2 % (ref 11.5–15.5)
WBC: 4.8 10*3/uL (ref 4.0–10.5)

## 2018-06-30 LAB — I-STAT TROPONIN, ED: TROPONIN I, POC: 0.03 ng/mL (ref 0.00–0.08)

## 2018-06-30 MED ORDER — PREDNISONE 20 MG PO TABS: 40.0000 mg | ORAL_TABLET | Freq: Every day | ORAL | 0 refills | Status: DC

## 2018-06-30 MED ORDER — DOXYCYCLINE HYCLATE 100 MG PO CAPS: 100.0000 mg | ORAL_CAPSULE | Freq: Two times a day (BID) | ORAL | 0 refills | Status: DC

## 2018-06-30 NOTE — ED Notes (Signed)
Pt ambulated without difficulty. Pts O2 sat 98-100% while ambulating. Pts HR 102 during ambulation

## 2018-06-30 NOTE — Discharge Instructions (Signed)
It was my pleasure taking care of you today!   Please take all of your antibiotics until finished!  Please follow-up with your primary care provider.  Return to the emergency department for new or worsening symptoms, any additional concerns.

## 2018-06-30 NOTE — ED Triage Notes (Signed)
Patient c/o left chest pain, productive cough with green sputum, and SOB x 1 week.

## 2018-06-30 NOTE — ED Notes (Signed)
ED Provider at bedside. 

## 2018-06-30 NOTE — ED Notes (Signed)
Patient transported to X-ray 
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
## 2018-10-30 ENCOUNTER — Emergency Department (HOSPITAL_COMMUNITY)
Admission: EM | Admit: 2018-10-30 | Discharge: 2018-10-30 | Disposition: A | Discharge status: Home / Self Care | Payer: Self-pay | Attending: Emergency Medicine | Admitting: Emergency Medicine

## 2018-10-30 ENCOUNTER — Other Ambulatory Visit: Payer: Self-pay

## 2018-10-30 ENCOUNTER — Emergency Department (HOSPITAL_COMMUNITY): Payer: Self-pay

## 2018-10-30 ENCOUNTER — Encounter (HOSPITAL_COMMUNITY): Payer: Self-pay | Admitting: Emergency Medicine

## 2018-10-30 DIAGNOSIS — I10 Essential (primary) hypertension: Secondary | ICD-10-CM | POA: Insufficient documentation

## 2018-10-30 DIAGNOSIS — F17210 Nicotine dependence, cigarettes, uncomplicated: Secondary | ICD-10-CM | POA: Insufficient documentation

## 2018-10-30 DIAGNOSIS — Z79899 Other long term (current) drug therapy: Secondary | ICD-10-CM | POA: Insufficient documentation

## 2018-10-30 DIAGNOSIS — R0789 Other chest pain: Secondary | ICD-10-CM | POA: Insufficient documentation

## 2018-10-30 DIAGNOSIS — J449 Chronic obstructive pulmonary disease, unspecified: Secondary | ICD-10-CM | POA: Insufficient documentation

## 2018-10-30 DIAGNOSIS — Z9049 Acquired absence of other specified parts of digestive tract: Secondary | ICD-10-CM | POA: Insufficient documentation

## 2018-10-30 LAB — CBC WITH DIFFERENTIAL/PLATELET
Abs Immature Granulocytes: 0.01 10*3/uL (ref 0.00–0.07)
Basophils Absolute: 0 10*3/uL (ref 0.0–0.1)
Basophils Relative: 1 %
EOS ABS: 0 10*3/uL (ref 0.0–0.5)
EOS PCT: 1 %
HEMATOCRIT: 56.1 % — AB (ref 39.0–52.0)
Hemoglobin: 19.3 g/dL — ABNORMAL HIGH (ref 13.0–17.0)
Immature Granulocytes: 0 %
LYMPHS ABS: 0.9 10*3/uL (ref 0.7–4.0)
Lymphocytes Relative: 29 %
MCH: 32.1 pg (ref 26.0–34.0)
MCHC: 34.4 g/dL (ref 30.0–36.0)
MCV: 93.3 fL (ref 80.0–100.0)
Monocytes Absolute: 0.5 10*3/uL (ref 0.1–1.0)
Monocytes Relative: 16 %
NRBC: 0 % (ref 0.0–0.2)
Neutro Abs: 1.7 10*3/uL (ref 1.7–7.7)
Neutrophils Relative %: 53 %
Platelets: 164 10*3/uL (ref 150–400)
RBC: 6.01 MIL/uL — ABNORMAL HIGH (ref 4.22–5.81)
RDW: 11.8 % (ref 11.5–15.5)
WBC: 3.2 10*3/uL — ABNORMAL LOW (ref 4.0–10.5)

## 2018-10-30 LAB — BASIC METABOLIC PANEL
Anion gap: 14 (ref 5–15)
BUN: 11 mg/dL (ref 6–20)
CALCIUM: 9.6 mg/dL (ref 8.9–10.3)
CO2: 26 mmol/L (ref 22–32)
CREATININE: 0.82 mg/dL (ref 0.61–1.24)
Chloride: 95 mmol/L — ABNORMAL LOW (ref 98–111)
GFR calc Af Amer: >60 mL/min (ref >60)
GFR calc non Af Amer: >60 mL/min (ref >60)
GLUCOSE: 94 mg/dL (ref 70–99)
Potassium: 3.5 mmol/L (ref 3.5–5.1)
Sodium: 135 mmol/L (ref 135–145)

## 2018-10-30 LAB — TROPONIN I: Troponin I: <0.03 ng/mL (ref <0.03)

## 2018-10-30 MED ORDER — CYCLOBENZAPRINE HCL 10 MG PO TABS: 10.0000 mg | ORAL_TABLET | Freq: Two times a day (BID) | ORAL | 0 refills | Status: AC | PRN

## 2018-10-30 MED ORDER — ACETAMINOPHEN 325 MG PO TABS
650.0000 mg | ORAL_TABLET | Freq: Once | ORAL | Status: AC
  Administered 2018-10-30: 650 mg via ORAL
  Filled 2018-10-30: qty 2

## 2018-10-30 MED ORDER — IBUPROFEN 800 MG PO TABS
800.0000 mg | ORAL_TABLET | Freq: Once | ORAL | Status: AC
  Administered 2018-10-30: 800 mg via ORAL
  Filled 2018-10-30: qty 1

## 2018-10-30 NOTE — ED Triage Notes (Signed)
Pt reports chest pain and soreness since yesterday after leaning over radiator working on [employer]. Pain increases with deep breathing and palpation of mid chest
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
## 2019-05-18 ENCOUNTER — Emergency Department (HOSPITAL_COMMUNITY)
Admission: EM | Admit: 2019-05-18 | Discharge: 2019-05-18 | Disposition: A | Discharge status: Home / Self Care | Payer: Self-pay | Attending: Emergency Medicine | Admitting: Emergency Medicine

## 2019-05-18 ENCOUNTER — Other Ambulatory Visit: Payer: Self-pay

## 2019-05-18 ENCOUNTER — Encounter (HOSPITAL_COMMUNITY): Payer: Self-pay

## 2019-05-18 DIAGNOSIS — I10 Essential (primary) hypertension: Secondary | ICD-10-CM | POA: Insufficient documentation

## 2019-05-18 DIAGNOSIS — J449 Chronic obstructive pulmonary disease, unspecified: Secondary | ICD-10-CM | POA: Insufficient documentation

## 2019-05-18 DIAGNOSIS — R220 Localized swelling, mass and lump, head: Secondary | ICD-10-CM

## 2019-05-18 DIAGNOSIS — L03211 Cellulitis of face: Secondary | ICD-10-CM | POA: Insufficient documentation

## 2019-05-18 DIAGNOSIS — L0201 Cutaneous abscess of face: Secondary | ICD-10-CM | POA: Insufficient documentation

## 2019-05-18 DIAGNOSIS — F17210 Nicotine dependence, cigarettes, uncomplicated: Secondary | ICD-10-CM | POA: Insufficient documentation

## 2019-05-18 DIAGNOSIS — Z79899 Other long term (current) drug therapy: Secondary | ICD-10-CM | POA: Insufficient documentation

## 2019-05-18 MED ORDER — DOXYCYCLINE HYCLATE 100 MG PO CAPS: 100.0000 mg | ORAL_CAPSULE | Freq: Two times a day (BID) | ORAL | 0 refills | Status: DC

## 2019-05-18 MED ORDER — LIDOCAINE HCL (PF) 1 % IJ SOLN
10.0000 mL | Freq: Once | INTRAMUSCULAR | Status: AC
  Administered 2019-05-18: 10 mL
  Filled 2019-05-18: qty 30

## 2019-05-18 MED ORDER — FENTANYL CITRATE (PF) 100 MCG/2ML IJ SOLN
50.0000 ug | Freq: Once | INTRAMUSCULAR | Status: AC
  Administered 2019-05-18: 50 ug via INTRAMUSCULAR
  Filled 2019-05-18: qty 2
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
## 2019-05-18 NOTE — Discharge Instructions (Signed)
Take antibiotics as prescribed.  Take entire course, even if your symptoms improve. Use Tylenol and ibuprofen as need for pain. Wash the area once a day with soap and water.  Reapply new dressing or Band-Aid every day. Follow-up with ear nose and throat doctor listed below for further evaluation. Return to the emergency room if you develop fevers, inability to move your eye, significant pain or swelling behind her eye, any new, worsening, or concerning symptoms.

## 2019-05-18 NOTE — ED Triage Notes (Signed)
Pt has swelling to R upper lip line and above with large knot noted. Swelling is extending up into cheek region. No redness to gumline or chipped teeth noted.

## 2021-05-16 ENCOUNTER — Other Ambulatory Visit: Payer: Self-pay

## 2021-05-16 ENCOUNTER — Emergency Department (HOSPITAL_COMMUNITY): Payer: Self-pay

## 2021-05-16 ENCOUNTER — Emergency Department (HOSPITAL_COMMUNITY)
Admission: EM | Admit: 2021-05-16 | Discharge: 2021-05-16 | Disposition: A | Discharge status: Home / Self Care | Payer: Self-pay | Attending: Emergency Medicine | Admitting: Emergency Medicine

## 2021-05-16 ENCOUNTER — Encounter (HOSPITAL_COMMUNITY): Payer: Self-pay

## 2021-05-16 DIAGNOSIS — R197 Diarrhea, unspecified: Secondary | ICD-10-CM

## 2021-05-16 DIAGNOSIS — F17210 Nicotine dependence, cigarettes, uncomplicated: Secondary | ICD-10-CM | POA: Insufficient documentation

## 2021-05-16 DIAGNOSIS — I10 Essential (primary) hypertension: Secondary | ICD-10-CM | POA: Insufficient documentation

## 2021-05-16 DIAGNOSIS — K921 Melena: Secondary | ICD-10-CM | POA: Insufficient documentation

## 2021-05-16 DIAGNOSIS — Z79899 Other long term (current) drug therapy: Secondary | ICD-10-CM | POA: Insufficient documentation

## 2021-05-16 DIAGNOSIS — J449 Chronic obstructive pulmonary disease, unspecified: Secondary | ICD-10-CM | POA: Insufficient documentation

## 2021-05-16 LAB — TYPE AND SCREEN
ABO/RH(D): O POS
Antibody Screen: NEGATIVE

## 2021-05-16 LAB — HEPATIC FUNCTION PANEL
ALT: 79 U/L — ABNORMAL HIGH (ref 0–44)
AST: 70 U/L — ABNORMAL HIGH (ref 15–41)
Albumin: 3.9 g/dL (ref 3.5–5.0)
Alkaline Phosphatase: 44 U/L (ref 38–126)
Bilirubin, Direct: 0.3 mg/dL — ABNORMAL HIGH (ref 0.0–0.2)
Indirect Bilirubin: 0.5 mg/dL (ref 0.3–0.9)
Total Bilirubin: 0.8 mg/dL (ref 0.3–1.2)
Total Protein: 7.1 g/dL (ref 6.5–8.1)

## 2021-05-16 LAB — BASIC METABOLIC PANEL
Anion gap: 8 (ref 5–15)
BUN: 11 mg/dL (ref 6–20)
CO2: 29 mmol/L (ref 22–32)
Calcium: 9.5 mg/dL (ref 8.9–10.3)
Chloride: 102 mmol/L (ref 98–111)
Creatinine, Ser: 0.6 mg/dL — ABNORMAL LOW (ref 0.61–1.24)
GFR, Estimated: >60 mL/min (ref >60)
Glucose, Bld: 102 mg/dL — ABNORMAL HIGH (ref 70–99)
Potassium: 3.6 mmol/L (ref 3.5–5.1)
Sodium: 139 mmol/L (ref 135–145)

## 2021-05-16 LAB — CBC WITH DIFFERENTIAL/PLATELET
Abs Immature Granulocytes: 0 K/uL (ref 0.00–0.07)
Basophils Absolute: 0 K/uL (ref 0.0–0.1)
Basophils Relative: 1 %
Eosinophils Absolute: 0 K/uL (ref 0.0–0.5)
Eosinophils Relative: 1 %
HCT: 48.6 % (ref 39.0–52.0)
Hemoglobin: 17.1 g/dL — ABNORMAL HIGH (ref 13.0–17.0)
Immature Granulocytes: 0 %
Lymphocytes Relative: 34 %
Lymphs Abs: 1 K/uL (ref 0.7–4.0)
MCH: 33.7 pg (ref 26.0–34.0)
MCHC: 35.2 g/dL (ref 30.0–36.0)
MCV: 95.9 fL (ref 80.0–100.0)
Monocytes Absolute: 0.6 K/uL (ref 0.1–1.0)
Monocytes Relative: 20 %
Neutro Abs: 1.3 K/uL — ABNORMAL LOW (ref 1.7–7.7)
Neutrophils Relative %: 44 %
Platelets: 155 K/uL (ref 150–400)
RBC: 5.07 MIL/uL (ref 4.22–5.81)
RDW: 12.6 % (ref 11.5–15.5)
WBC: 2.9 K/uL — ABNORMAL LOW (ref 4.0–10.5)
nRBC: 0 % (ref 0.0–0.2)

## 2021-05-16 LAB — HEMOGLOBIN AND HEMATOCRIT, BLOOD
HCT: 47.7 % (ref 39.0–52.0)
Hemoglobin: 16.5 g/dL (ref 13.0–17.0)

## 2021-05-16 LAB — POC OCCULT BLOOD, ED: Fecal Occult Bld: POSITIVE — AB

## 2021-05-16 LAB — ABO/RH: ABO/RH(D): O POS

## 2021-05-16 LAB — PROTIME-INR
INR: 1 (ref 0.8–1.2)
Prothrombin Time: 13 s (ref 11.4–15.2)

## 2021-05-16 MED ORDER — CIPROFLOXACIN HCL 500 MG PO TABS: 500.0000 mg | ORAL_TABLET | Freq: Two times a day (BID) | ORAL | 0 refills | Status: AC

## 2021-05-16 MED ORDER — DICYCLOMINE HCL 10 MG PO CAPS
20.0000 mg | ORAL_CAPSULE | Freq: Once | ORAL | Status: AC
  Administered 2021-05-16: 20 mg via ORAL
  Filled 2021-05-16: qty 2

## 2021-05-16 MED ORDER — CIPROFLOXACIN HCL 500 MG PO TABS: 500.0000 mg | ORAL_TABLET | Freq: Two times a day (BID) | ORAL | 0 refills | Status: DC

## 2021-05-16 MED ORDER — METRONIDAZOLE 500 MG PO TABS: 500.0000 mg | ORAL_TABLET | Freq: Three times a day (TID) | ORAL | 0 refills | Status: AC

## 2021-05-16 MED ORDER — METRONIDAZOLE 500 MG PO TABS
500.0000 mg | ORAL_TABLET | Freq: Once | ORAL | Status: AC
  Administered 2021-05-16: 500 mg via ORAL
  Filled 2021-05-16: qty 1

## 2021-05-16 MED ORDER — CIPROFLOXACIN HCL 500 MG PO TABS
500.0000 mg | ORAL_TABLET | Freq: Once | ORAL | Status: AC
  Administered 2021-05-16: 500 mg via ORAL
  Filled 2021-05-16: qty 1

## 2021-05-16 MED ORDER — METRONIDAZOLE 500 MG PO TABS: 500.0000 mg | ORAL_TABLET | Freq: Three times a day (TID) | ORAL | 0 refills | Status: DC

## 2021-05-16 MED ORDER — DICYCLOMINE HCL 20 MG PO TABS: 20.0000 mg | ORAL_TABLET | Freq: Two times a day (BID) | ORAL | 0 refills | Status: DC | PRN

## 2021-05-16 MED ORDER — IOHEXOL 350 MG/ML SOLN
100.0000 mL | Freq: Once | INTRAVENOUS | Status: AC | PRN
  Administered 2021-05-16: 75 mL via INTRAVENOUS

## 2021-05-16 MED ORDER — DICYCLOMINE HCL 20 MG PO TABS: 20.0000 mg | ORAL_TABLET | Freq: Two times a day (BID) | ORAL | 0 refills | Status: AC | PRN

## 2021-05-16 NOTE — ED Notes (Signed)
Provider at the bedside.  

## 2021-05-16 NOTE — ED Triage Notes (Signed)
Pt reports rectal bleeding x1 day. Started off bright red and now dark and looks like coffee. Patient denies abd pain/n/v. Pt reports hx of colostomy with reversal.

## 2021-05-16 NOTE — ED Notes (Signed)
I stat Hem Pos.  Given to MD and RN

## 2021-05-16 NOTE — Discharge Instructions (Addendum)
I am treating you for a possible infection or inflammation of your bowels with antibiotics.  Please take these as prescribed.  Continue drinking plenty of water.  I expect your symptoms improve over the next 2 to 3 days.  I also placed a referral to our GI doctors.
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
## 2021-07-11 ENCOUNTER — Encounter (HOSPITAL_COMMUNITY): Payer: Self-pay | Admitting: Emergency Medicine

## 2021-07-11 ENCOUNTER — Ambulatory Visit (HOSPITAL_COMMUNITY)
Admission: EM | Admit: 2021-07-11 | Discharge: 2021-07-11 | Disposition: A | Discharge status: Home / Self Care | Payer: Self-pay | Attending: Family Medicine | Admitting: Family Medicine

## 2021-07-11 ENCOUNTER — Other Ambulatory Visit: Payer: Self-pay

## 2021-07-11 DIAGNOSIS — K047 Periapical abscess without sinus: Secondary | ICD-10-CM

## 2021-07-11 MED ORDER — NAPROXEN 500 MG PO TABS: 500.0000 mg | ORAL_TABLET | Freq: Two times a day (BID) | ORAL | 0 refills | Status: DC

## 2021-07-11 MED ORDER — AMOXICILLIN-POT CLAVULANATE 875-125 MG PO TABS: 1.0000 | ORAL_TABLET | Freq: Two times a day (BID) | ORAL | 0 refills | Status: DC
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
## 2021-07-11 NOTE — Discharge Instructions (Addendum)
You have a dental infection.  We will treat you with an anti-inflammatory medication twice a day for pain (take it as needed twice daily).  Do not take ibuprofen, advil, aleve while you are taking this.  Take the antibiotic twice daily for the next 10 days.  Ultimately, you need to be seen by a dentist to help with this infection.  I have given you a list of dentists in the area.  If you have significant worsening of your pain, especially if you are unable to breathe or swallow, you should be seen at the emergency room right away.

## 2021-07-11 NOTE — ED Triage Notes (Signed)
Pt c/o right dental swelling that this morning. Had right dental pain x week.

## 2022-03-18 IMAGING — CT CT ABD-PELV W/ CM
2 of 5 series · 16 of 46 positions shown, 18 images · IV contrast (omnipaque)
Comparison: 03/12/2016

CLINICAL DATA: Diverticulitis suspected

EXAM:
CT ABDOMEN AND PELVIS WITH CONTRAST
TECHNIQUE: Multidetector CT imaging of the abdomen and pelvis was performed
using the standard protocol following bolus administration of
intravenous contrast.
CONTRAST:  75mL OMNIPAQUE IOHEXOL 350 MG/ML SOLN

[Series 2: axial st · axial · 0.77mm/px · z∈[-490,-120]mm · 13 of 88 slices shown, 15 images]
[im 7/88  soft-tissue]
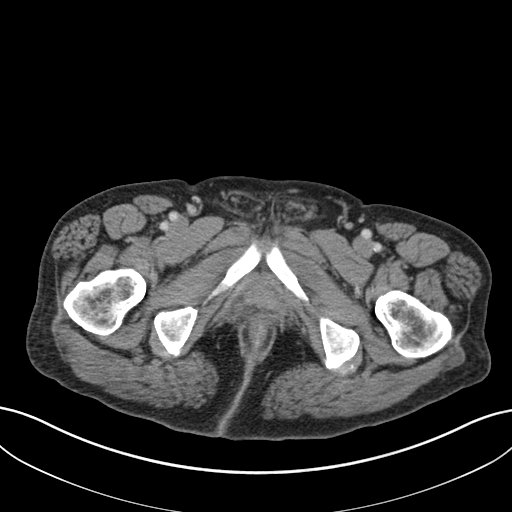
[im 7/88  bone]
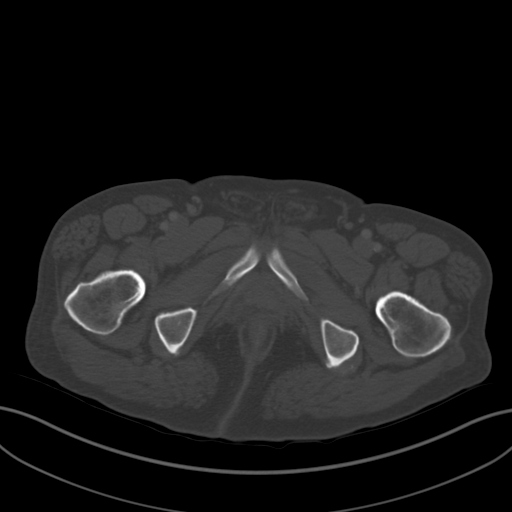
[im 13/88  soft-tissue]
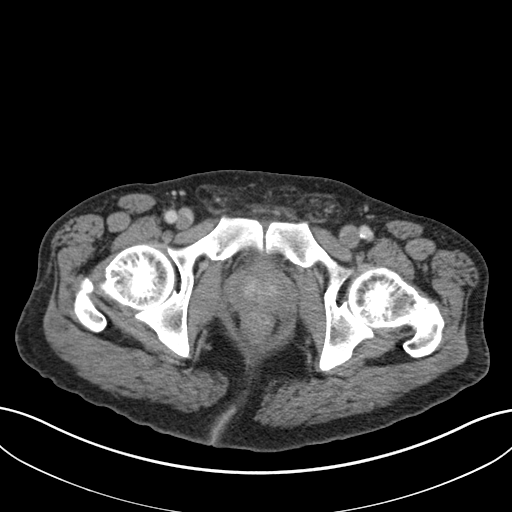
[im 19/88  soft-tissue]
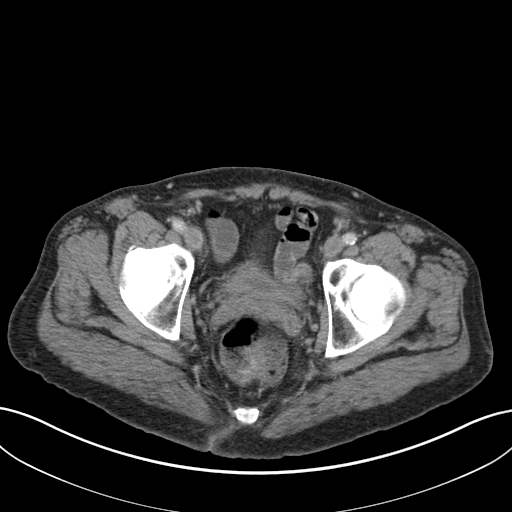
[im 25/88  soft-tissue]
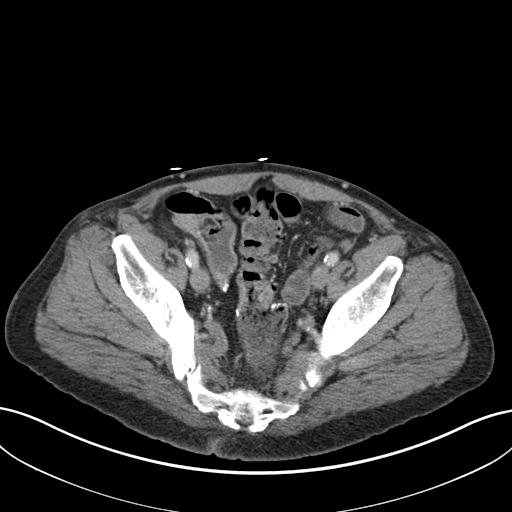
[im 32/88  soft-tissue]
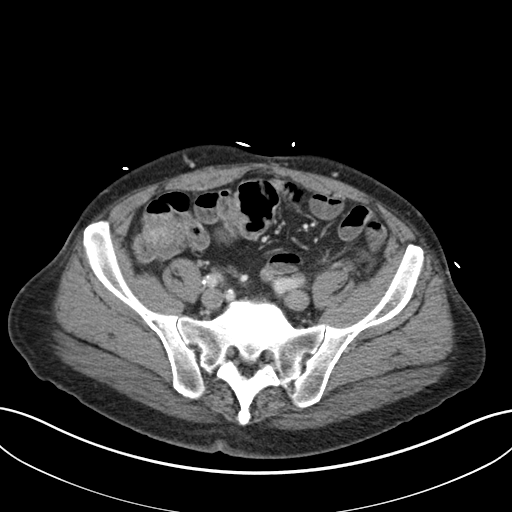
[im 38/88  soft-tissue]
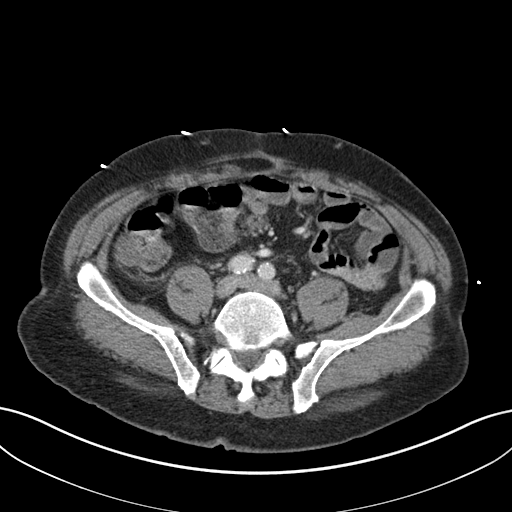
[im 44/88  soft-tissue]
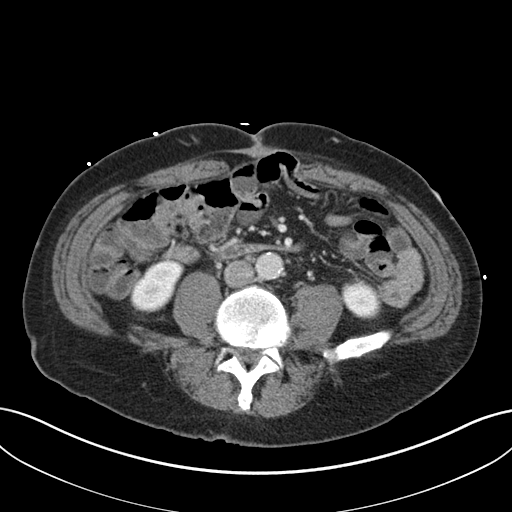
[im 50/88  soft-tissue]
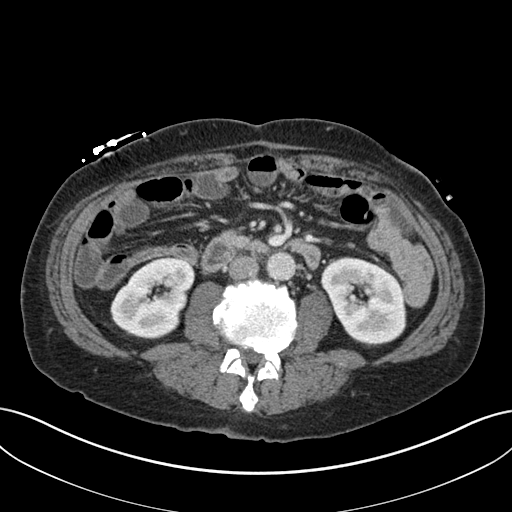
[im 56/88  soft-tissue]
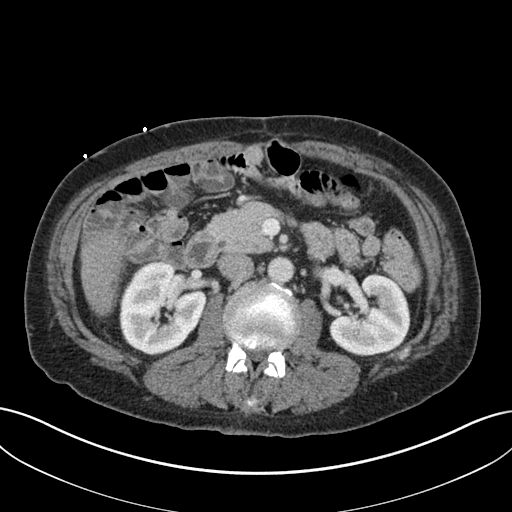
[im 56/88  bone]
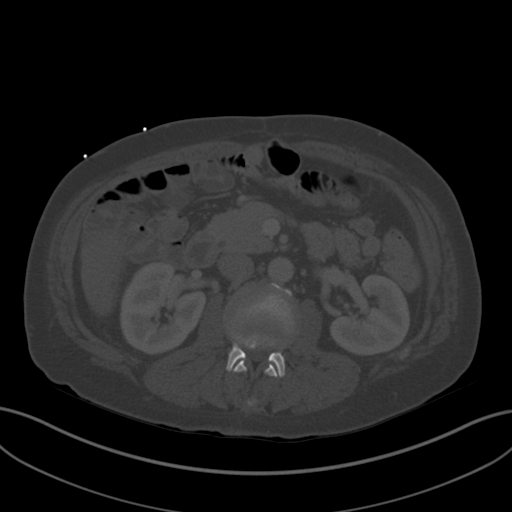
[im 63/88  soft-tissue]
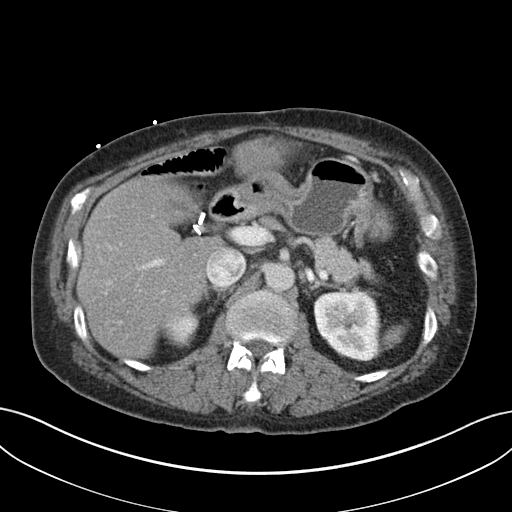
[im 69/88  soft-tissue]
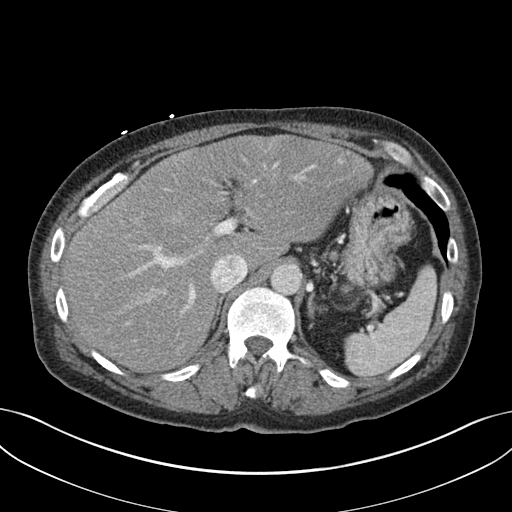
[im 75/88  soft-tissue]
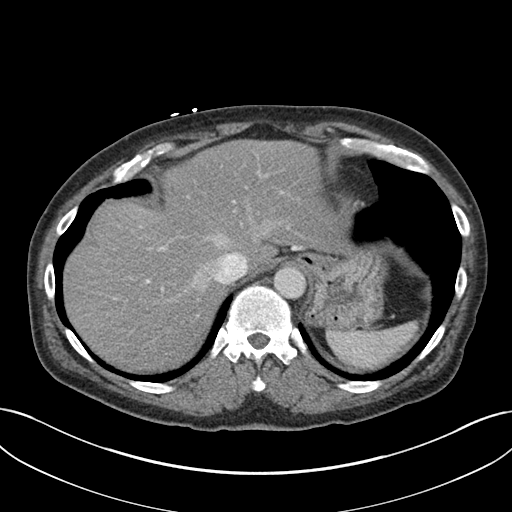
[im 81/88  soft-tissue]
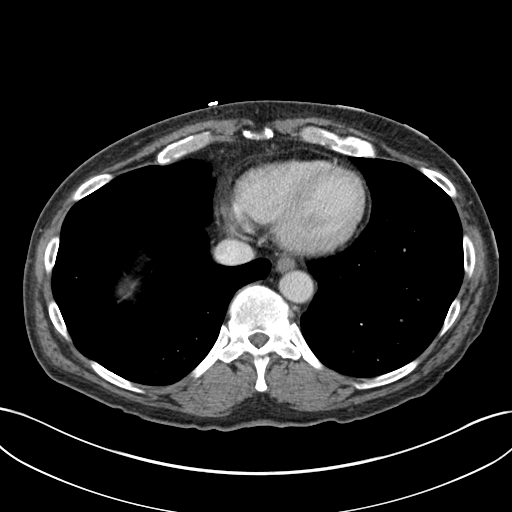

[Series 5: coronal st · coronal · 0.78mm/px · 3 of 146 slices shown]
[im 49/146  soft-tissue]
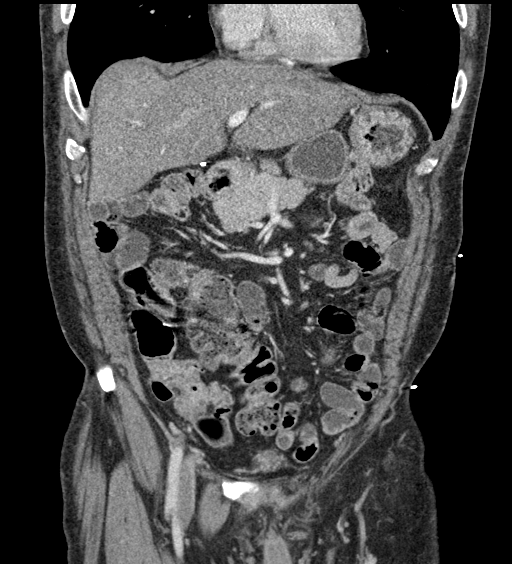
[im 65/146  soft-tissue]
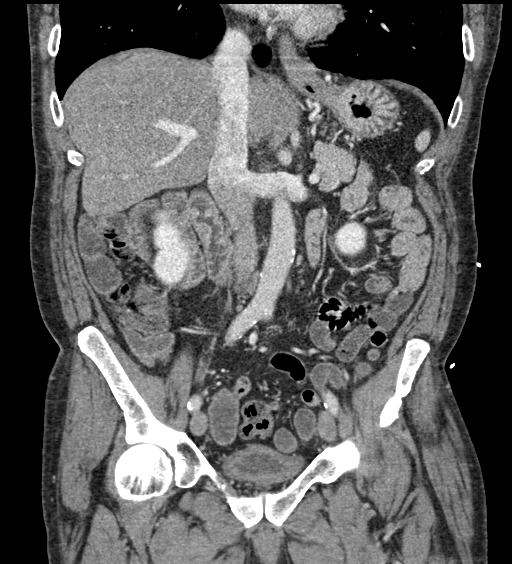
[im 81/146  soft-tissue]
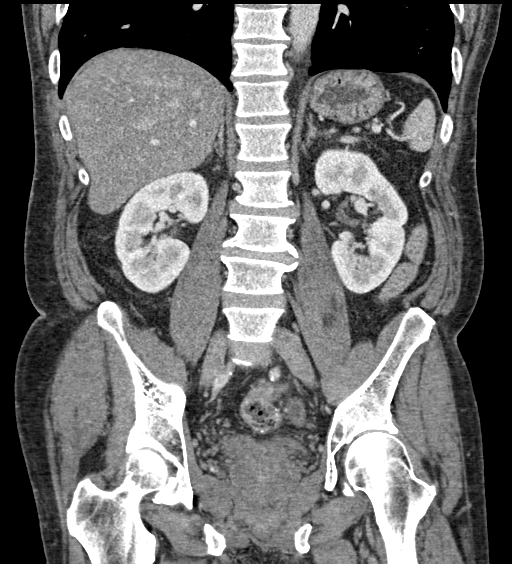

[16 of 46 positions shown; findings below may reference images not displayed]

FINDINGS: Lower chest: Minimal dependent atelectasis.  No pleural effusion.

Hepatobiliary: Hepatic steatosis. No focal liver lesions. The portal
vein is patent. No intra or extrahepatic biliary ductal dilatation.
The gallbladder is surgically absent.

Pancreas: Unremarkable. No pancreatic ductal dilatation or
surrounding inflammatory changes.

Spleen: Normal in size without focal abnormality.

Adrenals/Urinary Tract: The adrenal glands are unremarkable. The
kidneys enhance symmetrically with no hydronephrosis. The ureters
are unremarkable. The bladder is decompressed.

Stomach/Bowel: Redemonstrated loops of bowel in small ventral
hernias (series 2, image 33 and 45), without evidence of entrapment.
The appendix is surgically absent. No evidence of bowel obstruction.
The colorectal anastomosis appears patent. Colonic diverticulosis
without evidence of diverticulitis.

Vascular/Lymphatic: The abdominal aorta is normal in caliber, and
the central aspects of each branch vessels are patent. The IVC, SMV,
and splenic vein are unremarkable. No lymphadenopathy.

Reproductive: The prostate is present.

Other: No free fluid or free air.

Musculoskeletal: No acute osseous abnormality.
IMPRESSION: Colonic diverticulosis without evidence of diverticulitis.

## 2023-01-14 ENCOUNTER — Other Ambulatory Visit: Payer: Self-pay

## 2023-01-14 ENCOUNTER — Emergency Department (HOSPITAL_COMMUNITY): Payer: Medicaid Other

## 2023-01-14 ENCOUNTER — Encounter (HOSPITAL_COMMUNITY): Payer: Self-pay

## 2023-01-14 ENCOUNTER — Inpatient Hospital Stay (HOSPITAL_COMMUNITY)
Admission: EM | Admit: 2023-01-14 | Discharge: 2023-01-17 | DRG: 086 | Disposition: A | Discharge status: Home / Self Care | Payer: Medicaid Other | Attending: Internal Medicine | Admitting: Internal Medicine

## 2023-01-14 DIAGNOSIS — F1010 Alcohol abuse, uncomplicated: Secondary | ICD-10-CM | POA: Diagnosis not present

## 2023-01-14 DIAGNOSIS — W1789XA Other fall from one level to another, initial encounter: Secondary | ICD-10-CM | POA: Diagnosis present

## 2023-01-14 DIAGNOSIS — J9811 Atelectasis: Secondary | ICD-10-CM | POA: Diagnosis present

## 2023-01-14 DIAGNOSIS — Z79899 Other long term (current) drug therapy: Secondary | ICD-10-CM

## 2023-01-14 DIAGNOSIS — Y92008 Other place in unspecified non-institutional (private) residence as the place of occurrence of the external cause: Secondary | ICD-10-CM | POA: Diagnosis not present

## 2023-01-14 DIAGNOSIS — S0081XA Abrasion of other part of head, initial encounter: Secondary | ICD-10-CM | POA: Diagnosis present

## 2023-01-14 DIAGNOSIS — I10 Essential (primary) hypertension: Secondary | ICD-10-CM | POA: Diagnosis present

## 2023-01-14 DIAGNOSIS — J439 Emphysema, unspecified: Secondary | ICD-10-CM | POA: Diagnosis not present

## 2023-01-14 DIAGNOSIS — R609 Edema, unspecified: Secondary | ICD-10-CM | POA: Diagnosis not present

## 2023-01-14 DIAGNOSIS — Z9049 Acquired absence of other specified parts of digestive tract: Secondary | ICD-10-CM | POA: Diagnosis not present

## 2023-01-14 DIAGNOSIS — Z833 Family history of diabetes mellitus: Secondary | ICD-10-CM | POA: Diagnosis not present

## 2023-01-14 DIAGNOSIS — R0902 Hypoxemia: Secondary | ICD-10-CM | POA: Diagnosis not present

## 2023-01-14 DIAGNOSIS — F17210 Nicotine dependence, cigarettes, uncomplicated: Secondary | ICD-10-CM | POA: Diagnosis present

## 2023-01-14 DIAGNOSIS — D751 Secondary polycythemia: Secondary | ICD-10-CM | POA: Diagnosis not present

## 2023-01-14 DIAGNOSIS — R7989 Other specified abnormal findings of blood chemistry: Secondary | ICD-10-CM | POA: Diagnosis present

## 2023-01-14 DIAGNOSIS — S066X0A Traumatic subarachnoid hemorrhage without loss of consciousness, initial encounter: Principal | ICD-10-CM | POA: Diagnosis present

## 2023-01-14 DIAGNOSIS — R9431 Abnormal electrocardiogram [ECG] [EKG]: Secondary | ICD-10-CM | POA: Diagnosis present

## 2023-01-14 DIAGNOSIS — R531 Weakness: Secondary | ICD-10-CM | POA: Diagnosis not present

## 2023-01-14 DIAGNOSIS — S0990XA Unspecified injury of head, initial encounter: Secondary | ICD-10-CM

## 2023-01-14 DIAGNOSIS — E876 Hypokalemia: Secondary | ICD-10-CM | POA: Diagnosis present

## 2023-01-14 DIAGNOSIS — R519 Headache, unspecified: Secondary | ICD-10-CM | POA: Diagnosis not present

## 2023-01-14 DIAGNOSIS — I428 Other cardiomyopathies: Secondary | ICD-10-CM | POA: Diagnosis not present

## 2023-01-14 LAB — CBC WITH DIFFERENTIAL/PLATELET
Abs Immature Granulocytes: 0.02 10*3/uL (ref 0.00–0.07)
Basophils Absolute: 0 10*3/uL (ref 0.0–0.1)
Basophils Relative: 0 %
Eosinophils Absolute: 0 10*3/uL (ref 0.0–0.5)
Eosinophils Relative: 0 %
HCT: 58.7 % — ABNORMAL HIGH (ref 39.0–52.0)
Hemoglobin: 20 g/dL — ABNORMAL HIGH (ref 13.0–17.0)
Immature Granulocytes: 0 %
Lymphocytes Relative: 14 %
Lymphs Abs: 0.9 10*3/uL (ref 0.7–4.0)
MCH: 32.1 pg (ref 26.0–34.0)
MCHC: 34.1 g/dL (ref 30.0–36.0)
MCV: 94.1 fL (ref 80.0–100.0)
Monocytes Absolute: 0.6 10*3/uL (ref 0.1–1.0)
Monocytes Relative: 9 %
Neutro Abs: 5 10*3/uL (ref 1.7–7.7)
Neutrophils Relative %: 77 %
Platelets: 153 10*3/uL (ref 150–400)
RBC: 6.24 MIL/uL — ABNORMAL HIGH (ref 4.22–5.81)
RDW: 12.3 % (ref 11.5–15.5)
WBC: 6.5 10*3/uL (ref 4.0–10.5)
nRBC: 0 % (ref 0.0–0.2)

## 2023-01-14 LAB — I-STAT CHEM 8, ED
BUN: 9 mg/dL (ref 6–20)
Calcium, Ion: 1.1 mmol/L — ABNORMAL LOW (ref 1.15–1.40)
Chloride: 102 mmol/L (ref 98–111)
Creatinine, Ser: 0.9 mg/dL (ref 0.61–1.24)
Glucose, Bld: 84 mg/dL (ref 70–99)
HCT: 60 % — ABNORMAL HIGH (ref 39.0–52.0)
Hemoglobin: 20.4 g/dL — ABNORMAL HIGH (ref 13.0–17.0)
Potassium: 3.5 mmol/L (ref 3.5–5.1)
Sodium: 140 mmol/L (ref 135–145)
TCO2: 25 mmol/L (ref 22–32)

## 2023-01-14 LAB — COMPREHENSIVE METABOLIC PANEL
ALT: 36 U/L (ref 0–44)
AST: 37 U/L (ref 15–41)
Albumin: 3.9 g/dL (ref 3.5–5.0)
Alkaline Phosphatase: 71 U/L (ref 38–126)
Anion gap: 10 (ref 5–15)
BUN: 10 mg/dL (ref 6–20)
CO2: 24 mmol/L (ref 22–32)
Calcium: 8.6 mg/dL — ABNORMAL LOW (ref 8.9–10.3)
Chloride: 102 mmol/L (ref 98–111)
Creatinine, Ser: 0.84 mg/dL (ref 0.61–1.24)
GFR, Estimated: >60 mL/min (ref >60)
Glucose, Bld: 88 mg/dL (ref 70–99)
Potassium: 3.3 mmol/L — ABNORMAL LOW (ref 3.5–5.1)
Sodium: 136 mmol/L (ref 135–145)
Total Bilirubin: 1.2 mg/dL (ref 0.3–1.2)
Total Protein: 7.9 g/dL (ref 6.5–8.1)

## 2023-01-14 LAB — URINALYSIS, ROUTINE W REFLEX MICROSCOPIC
Bacteria, UA: NONE SEEN
Bilirubin Urine: NEGATIVE
Glucose, UA: NEGATIVE mg/dL
Ketones, ur: NEGATIVE mg/dL
Leukocytes,Ua: NEGATIVE
Nitrite: NEGATIVE
Protein, ur: 100 mg/dL — AB
Specific Gravity, Urine: 1.018 (ref 1.005–1.030)
pH: 5 (ref 5.0–8.0)

## 2023-01-14 LAB — LIPASE, BLOOD: Lipase: 28 U/L (ref 11–51)

## 2023-01-14 MED ORDER — IOHEXOL 300 MG/ML  SOLN
100.0000 mL | Freq: Once | INTRAMUSCULAR | Status: AC | PRN
  Administered 2023-01-14: 100 mL via INTRAVENOUS

## 2023-01-14 MED ORDER — OXYCODONE HCL 5 MG PO TABS
5.0000 mg | ORAL_TABLET | Freq: Once | ORAL | Status: AC
  Administered 2023-01-14: 5 mg via ORAL
  Filled 2023-01-14: qty 1

## 2023-01-14 MED ORDER — IPRATROPIUM-ALBUTEROL 0.5-2.5 (3) MG/3ML IN SOLN
3.0000 mL | RESPIRATORY_TRACT | Status: AC | PRN
  Administered 2023-01-14 (×3): 3 mL via RESPIRATORY_TRACT
  Filled 2023-01-14: qty 6
  Filled 2023-01-14: qty 3

## 2023-01-14 MED ORDER — MAGNESIUM SULFATE 2 GM/50ML IV SOLN
2.0000 g | Freq: Once | INTRAVENOUS | Status: AC
  Administered 2023-01-14: 2 g via INTRAVENOUS
  Filled 2023-01-14: qty 50

## 2023-01-14 MED ORDER — METHYLPREDNISOLONE SODIUM SUCC 125 MG IJ SOLR
125.0000 mg | Freq: Once | INTRAMUSCULAR | Status: AC
  Administered 2023-01-14: 125 mg via INTRAVENOUS
  Filled 2023-01-14: qty 2
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
## 2023-01-14 NOTE — ED Notes (Signed)
Patient transported to CT 

## 2023-01-14 NOTE — Progress Notes (Signed)
This Clinical research associate attempted abg x1 but unsuccessful in obtaining sample.  Patient is refusing any further attempts at this time.  ED doc made aware.

## 2023-01-15 ENCOUNTER — Inpatient Hospital Stay (HOSPITAL_COMMUNITY): Payer: Medicaid Other

## 2023-01-15 ENCOUNTER — Encounter (HOSPITAL_COMMUNITY): Payer: Self-pay | Admitting: Internal Medicine

## 2023-01-15 DIAGNOSIS — E876 Hypokalemia: Secondary | ICD-10-CM | POA: Diagnosis not present

## 2023-01-15 LAB — CBC
HCT: 55.8 % — ABNORMAL HIGH (ref 39.0–52.0)
Hemoglobin: 19.4 g/dL — ABNORMAL HIGH (ref 13.0–17.0)
MCH: 32.1 pg (ref 26.0–34.0)
MCHC: 34.8 g/dL (ref 30.0–36.0)
MCV: 92.4 fL (ref 80.0–100.0)
Platelets: 158 10*3/uL (ref 150–400)
RBC: 6.04 MIL/uL — ABNORMAL HIGH (ref 4.22–5.81)
RDW: 12.2 % (ref 11.5–15.5)
WBC: 5.6 10*3/uL (ref 4.0–10.5)
nRBC: 0 % (ref 0.0–0.2)

## 2023-01-15 LAB — ETHANOL: Alcohol, Ethyl (B): <10 mg/dL (ref <10)

## 2023-01-15 LAB — BASIC METABOLIC PANEL
Anion gap: 8 (ref 5–15)
BUN: 10 mg/dL (ref 6–20)
CO2: 22 mmol/L (ref 22–32)
Calcium: 8.6 mg/dL — ABNORMAL LOW (ref 8.9–10.3)
Chloride: 104 mmol/L (ref 98–111)
Creatinine, Ser: 0.77 mg/dL (ref 0.61–1.24)
GFR, Estimated: >60 mL/min (ref >60)
Glucose, Bld: 130 mg/dL — ABNORMAL HIGH (ref 70–99)
Potassium: 3.7 mmol/L (ref 3.5–5.1)
Sodium: 134 mmol/L — ABNORMAL LOW (ref 135–145)

## 2023-01-15 LAB — PHOSPHORUS: Phosphorus: 2.6 mg/dL (ref 2.5–4.6)

## 2023-01-15 LAB — MAGNESIUM: Magnesium: 2.5 mg/dL — ABNORMAL HIGH (ref 1.7–2.4)

## 2023-01-15 LAB — RAPID URINE DRUG SCREEN, HOSP PERFORMED
Amphetamines: NOT DETECTED
Barbiturates: NOT DETECTED
Benzodiazepines: NOT DETECTED
Cocaine: NOT DETECTED
Opiates: NOT DETECTED
Tetrahydrocannabinol: POSITIVE — AB

## 2023-01-15 LAB — BRAIN NATRIURETIC PEPTIDE: B Natriuretic Peptide: 679.7 pg/mL — ABNORMAL HIGH (ref 0.0–100.0)

## 2023-01-15 MED ORDER — THIAMINE HCL 100 MG/ML IJ SOLN: 100.0000 mg | Freq: Every day | INTRAMUSCULAR | Status: DC

## 2023-01-15 MED ORDER — IPRATROPIUM-ALBUTEROL 0.5-2.5 (3) MG/3ML IN SOLN: 3.0000 mL | RESPIRATORY_TRACT | Status: DC | PRN

## 2023-01-15 MED ORDER — AMLODIPINE BESYLATE 10 MG PO TABS
5.0000 mg | ORAL_TABLET | Freq: Every day | ORAL | Status: DC
  Administered 2023-01-15 – 2023-01-17 (×3): 5 mg via ORAL
  Filled 2023-01-15 (×3): qty 1

## 2023-01-15 MED ORDER — PREDNISONE 20 MG PO TABS
40.0000 mg | ORAL_TABLET | Freq: Every day | ORAL | Status: DC
  Administered 2023-01-15 – 2023-01-17 (×3): 40 mg via ORAL
  Filled 2023-01-15 (×3): qty 2

## 2023-01-15 MED ORDER — LACTATED RINGERS IV SOLN: INTRAVENOUS | Status: DC

## 2023-01-15 MED ORDER — ADULT MULTIVITAMIN W/MINERALS CH
1.0000 | ORAL_TABLET | Freq: Every day | ORAL | Status: DC
  Administered 2023-01-15 – 2023-01-17 (×3): 1 via ORAL
  Filled 2023-01-15 (×3): qty 1

## 2023-01-15 MED ORDER — SODIUM CHLORIDE 0.9 % IV SOLN: INTRAVENOUS | Status: DC

## 2023-01-15 MED ORDER — HYDROMORPHONE HCL 1 MG/ML IJ SOLN
0.5000 mg | INTRAMUSCULAR | Status: AC | PRN
  Administered 2023-01-15 (×3): 0.5 mg via INTRAVENOUS
  Filled 2023-01-15 (×2): qty 0.5
  Filled 2023-01-15: qty 1

## 2023-01-15 MED ORDER — ORAL CARE MOUTH RINSE: 15.0000 mL | OROMUCOSAL | Status: DC | PRN

## 2023-01-15 MED ORDER — POLYETHYLENE GLYCOL 3350 17 G PO PACK
17.0000 g | PACK | Freq: Every day | ORAL | Status: DC | PRN
  Administered 2023-01-16: 17 g via ORAL
  Filled 2023-01-15: qty 1

## 2023-01-15 MED ORDER — PROCHLORPERAZINE EDISYLATE 10 MG/2ML IJ SOLN: 5.0000 mg | Freq: Four times a day (QID) | INTRAMUSCULAR | Status: DC | PRN

## 2023-01-15 MED ORDER — LABETALOL HCL 5 MG/ML IV SOLN
5.0000 mg | INTRAVENOUS | Status: DC | PRN
  Administered 2023-01-15: 5 mg via INTRAVENOUS

## 2023-01-15 MED ORDER — OXYCODONE HCL 5 MG PO TABS
5.0000 mg | ORAL_TABLET | Freq: Four times a day (QID) | ORAL | Status: AC | PRN
  Administered 2023-01-15 (×3): 5 mg via ORAL
  Filled 2023-01-15 (×3): qty 1

## 2023-01-15 MED ORDER — ONDANSETRON HCL 4 MG/2ML IJ SOLN: 4.0000 mg | Freq: Four times a day (QID) | INTRAMUSCULAR | Status: DC | PRN

## 2023-01-15 MED ORDER — ACETAMINOPHEN 325 MG PO TABS
650.0000 mg | ORAL_TABLET | Freq: Four times a day (QID) | ORAL | Status: DC | PRN
  Administered 2023-01-15 – 2023-01-16 (×2): 650 mg via ORAL
  Filled 2023-01-15 (×2): qty 2

## 2023-01-15 MED ORDER — MELATONIN 3 MG PO TABS: 3.0000 mg | ORAL_TABLET | Freq: Every evening | ORAL | Status: DC | PRN

## 2023-01-15 MED ORDER — LORAZEPAM 2 MG/ML IJ SOLN: 1.0000 mg | INTRAMUSCULAR | Status: DC | PRN

## 2023-01-15 MED ORDER — NICOTINE 14 MG/24HR TD PT24
14.0000 mg | MEDICATED_PATCH | Freq: Every day | TRANSDERMAL | Status: DC
  Administered 2023-01-15 – 2023-01-17 (×3): 14 mg via TRANSDERMAL
  Filled 2023-01-15 (×3): qty 1

## 2023-01-15 MED ORDER — IPRATROPIUM-ALBUTEROL 0.5-2.5 (3) MG/3ML IN SOLN
3.0000 mL | Freq: Two times a day (BID) | RESPIRATORY_TRACT | Status: DC
  Administered 2023-01-15 – 2023-01-17 (×5): 3 mL via RESPIRATORY_TRACT
  Filled 2023-01-15 (×5): qty 3

## 2023-01-15 MED ORDER — LABETALOL HCL 5 MG/ML IV SOLN
5.0000 mg | INTRAVENOUS | Status: DC | PRN
  Administered 2023-01-15: 5 mg via INTRAVENOUS
  Filled 2023-01-15 (×2): qty 4

## 2023-01-15 MED ORDER — IOHEXOL 350 MG/ML SOLN
75.0000 mL | Freq: Once | INTRAVENOUS | Status: AC | PRN
  Administered 2023-01-15: 75 mL via INTRAVENOUS

## 2023-01-15 MED ORDER — THIAMINE MONONITRATE 100 MG PO TABS
100.0000 mg | ORAL_TABLET | Freq: Every day | ORAL | Status: DC
  Administered 2023-01-15 – 2023-01-17 (×3): 100 mg via ORAL
  Filled 2023-01-15 (×3): qty 1

## 2023-01-15 MED ORDER — FOLIC ACID 1 MG PO TABS
1.0000 mg | ORAL_TABLET | Freq: Every day | ORAL | Status: DC
  Administered 2023-01-15 – 2023-01-17 (×3): 1 mg via ORAL
  Filled 2023-01-15 (×3): qty 1

## 2023-01-15 MED ORDER — FUROSEMIDE 10 MG/ML IJ SOLN
40.0000 mg | Freq: Two times a day (BID) | INTRAMUSCULAR | Status: AC
  Administered 2023-01-15 – 2023-01-16 (×2): 40 mg via INTRAVENOUS
  Filled 2023-01-15 (×2): qty 4

## 2023-01-15 MED ORDER — LORAZEPAM 1 MG PO TABS
1.0000 mg | ORAL_TABLET | ORAL | Status: DC | PRN
  Administered 2023-01-15 – 2023-01-17 (×3): 1 mg via ORAL
  Filled 2023-01-15 (×3): qty 1

## 2023-01-15 MED ORDER — TRAZODONE HCL 50 MG PO TABS
50.0000 mg | ORAL_TABLET | Freq: Every evening | ORAL | Status: DC | PRN
  Administered 2023-01-15 – 2023-01-16 (×2): 50 mg via ORAL
  Filled 2023-01-15 (×2): qty 1

## 2023-01-15 MED ORDER — GUAIFENESIN 100 MG/5ML PO LIQD: 5.0000 mL | ORAL | Status: DC | PRN

## 2023-01-15 MED ORDER — SENNOSIDES-DOCUSATE SODIUM 8.6-50 MG PO TABS: 1.0000 | ORAL_TABLET | Freq: Every evening | ORAL | Status: DC | PRN

## 2023-01-15 MED ORDER — POTASSIUM CHLORIDE 10 MEQ/100ML IV SOLN
10.0000 meq | Freq: Once | INTRAVENOUS | Status: AC
  Administered 2023-01-15: 10 meq via INTRAVENOUS
  Filled 2023-01-15: qty 100
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
## 2023-01-16 DIAGNOSIS — E876 Hypokalemia: Secondary | ICD-10-CM | POA: Diagnosis not present

## 2023-01-16 LAB — BASIC METABOLIC PANEL
Anion gap: 9 (ref 5–15)
BUN: 18 mg/dL (ref 6–20)
CO2: 24 mmol/L (ref 22–32)
Calcium: 8.9 mg/dL (ref 8.9–10.3)
Chloride: 102 mmol/L (ref 98–111)
Creatinine, Ser: 0.88 mg/dL (ref 0.61–1.24)
GFR, Estimated: >60 mL/min (ref >60)
Glucose, Bld: 122 mg/dL — ABNORMAL HIGH (ref 70–99)
Potassium: 3.8 mmol/L (ref 3.5–5.1)
Sodium: 135 mmol/L (ref 135–145)

## 2023-01-16 LAB — CBC
HCT: 53.4 % — ABNORMAL HIGH (ref 39.0–52.0)
Hemoglobin: 18.3 g/dL — ABNORMAL HIGH (ref 13.0–17.0)
MCH: 32.3 pg (ref 26.0–34.0)
MCHC: 34.3 g/dL (ref 30.0–36.0)
MCV: 94.3 fL (ref 80.0–100.0)
Platelets: 148 10*3/uL — ABNORMAL LOW (ref 150–400)
RBC: 5.66 MIL/uL (ref 4.22–5.81)
RDW: 12.4 % (ref 11.5–15.5)
WBC: 8.8 10*3/uL (ref 4.0–10.5)
nRBC: 0 % (ref 0.0–0.2)

## 2023-01-16 LAB — MISC LABCORP TEST (SEND OUT): Labcorp test code: 83935

## 2023-01-16 LAB — MAGNESIUM: Magnesium: 2 mg/dL (ref 1.7–2.4)

## 2023-01-16 MED ORDER — BUTALBITAL-APAP-CAFFEINE 50-325-40 MG PO TABS
1.0000 | ORAL_TABLET | Freq: Four times a day (QID) | ORAL | Status: DC | PRN
  Administered 2023-01-16 – 2023-01-17 (×3): 1 via ORAL
  Filled 2023-01-16 (×3): qty 1

## 2023-01-16 MED ORDER — BUTALBITAL-APAP-CAFFEINE 50-325-40 MG PO TABS
1.0000 | ORAL_TABLET | ORAL | Status: AC | PRN
  Administered 2023-01-16: 1 via ORAL
  Filled 2023-01-16: qty 1

## 2023-01-16 MED ORDER — FUROSEMIDE 10 MG/ML IJ SOLN
40.0000 mg | Freq: Once | INTRAMUSCULAR | Status: AC
  Administered 2023-01-16: 40 mg via INTRAVENOUS
  Filled 2023-01-16: qty 4

## 2023-01-16 MED ORDER — OXYCODONE HCL 5 MG PO TABS
5.0000 mg | ORAL_TABLET | ORAL | Status: DC | PRN
  Administered 2023-01-16 (×2): 5 mg via ORAL
  Filled 2023-01-16 (×3): qty 1

## 2023-01-16 MED ORDER — FUROSEMIDE 10 MG/ML IJ SOLN: 40.0000 mg | Freq: Two times a day (BID) | INTRAMUSCULAR | Status: DC

## 2023-01-16 NOTE — Progress Notes (Signed)
SATURATION QUALIFICATIONS: (This note is used to comply with regulatory documentation for home oxygen)  Patient Saturations on Room Air at Rest = 89%  Patient Saturations on Room Air while Ambulating = 85%  Patient Saturations on 2 Liters of oxygen while Ambulating = 91%  Please briefly explain why patient needs home oxygen: Pt O2 drops below 88% on room air while ambulating.
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
## 2023-01-16 NOTE — Progress Notes (Signed)
PT Cancellation Note  Patient Details Name: Aaron Rodgers MRN: [id number] DOB: Nov 16, 1960   Cancelled Treatment:    Reason Eval/Treat Not Completed: Other (comment) Patient  reports that he just ambulated and His BP was high. Plans to take a shower later. Will check back in am for any further PT needs. Blanchard Kelch PT Acute Rehabilitation Services Office [phone] Weekend pager-[phone]   Rada Hay 01/16/2023, 1:01 PM

## 2023-01-17 ENCOUNTER — Inpatient Hospital Stay (HOSPITAL_COMMUNITY): Payer: Medicaid Other

## 2023-01-17 DIAGNOSIS — E876 Hypokalemia: Secondary | ICD-10-CM | POA: Diagnosis not present

## 2023-01-17 DIAGNOSIS — I428 Other cardiomyopathies: Secondary | ICD-10-CM

## 2023-01-17 LAB — ECHOCARDIOGRAM COMPLETE
Area-P 1/2: 2.94 cm2
Height: 69 in
S' Lateral: 3 cm
Weight: 3200 oz

## 2023-01-17 LAB — BASIC METABOLIC PANEL
Anion gap: 12 (ref 5–15)
BUN: 24 mg/dL — ABNORMAL HIGH (ref 6–20)
CO2: 23 mmol/L (ref 22–32)
Calcium: 8.7 mg/dL — ABNORMAL LOW (ref 8.9–10.3)
Chloride: 99 mmol/L (ref 98–111)
Creatinine, Ser: 0.85 mg/dL (ref 0.61–1.24)
GFR, Estimated: >60 mL/min (ref >60)
Glucose, Bld: 109 mg/dL — ABNORMAL HIGH (ref 70–99)
Potassium: 3.9 mmol/L (ref 3.5–5.1)
Sodium: 134 mmol/L — ABNORMAL LOW (ref 135–145)

## 2023-01-17 LAB — CBC
HCT: 54.6 % — ABNORMAL HIGH (ref 39.0–52.0)
Hemoglobin: 18.7 g/dL — ABNORMAL HIGH (ref 13.0–17.0)
MCH: 32.4 pg (ref 26.0–34.0)
MCHC: 34.2 g/dL (ref 30.0–36.0)
MCV: 94.6 fL (ref 80.0–100.0)
Platelets: 141 10*3/uL — ABNORMAL LOW (ref 150–400)
RBC: 5.77 MIL/uL (ref 4.22–5.81)
RDW: 12.4 % (ref 11.5–15.5)
WBC: 7.7 10*3/uL (ref 4.0–10.5)
nRBC: 0 % (ref 0.0–0.2)

## 2023-01-17 LAB — MAGNESIUM: Magnesium: 2.4 mg/dL (ref 1.7–2.4)

## 2023-01-17 MED ORDER — OXYCODONE HCL 5 MG PO TABS: 5.0000 mg | ORAL_TABLET | Freq: Four times a day (QID) | ORAL | 0 refills | Status: AC | PRN

## 2023-01-17 MED ORDER — VITAMIN B-1 100 MG PO TABS: 100.0000 mg | ORAL_TABLET | Freq: Every day | ORAL | 0 refills | Status: AC

## 2023-01-17 MED ORDER — NICOTINE 14 MG/24HR TD PT24: 14.0000 mg | MEDICATED_PATCH | Freq: Every day | TRANSDERMAL | 0 refills | Status: AC

## 2023-01-17 MED ORDER — SENNOSIDES-DOCUSATE SODIUM 8.6-50 MG PO TABS: 1.0000 | ORAL_TABLET | Freq: Every evening | ORAL | 0 refills | Status: AC | PRN

## 2023-01-17 MED ORDER — AMLODIPINE BESYLATE 5 MG PO TABS: 5.0000 mg | ORAL_TABLET | Freq: Every day | ORAL | 0 refills | Status: AC

## 2023-01-17 MED ORDER — FOLIC ACID 1 MG PO TABS: 1.0000 mg | ORAL_TABLET | Freq: Every day | ORAL | 0 refills | Status: AC

## 2023-01-17 MED ORDER — PREDNISONE 20 MG PO TABS: 40.0000 mg | ORAL_TABLET | Freq: Every day | ORAL | 0 refills | Status: AC

## 2023-01-17 MED ORDER — ADULT MULTIVITAMIN W/MINERALS CH: 1.0000 | ORAL_TABLET | Freq: Every day | ORAL | Status: AC

## 2023-01-17 MED ORDER — PERFLUTREN LIPID MICROSPHERE
1.0000 mL | INTRAVENOUS | Status: AC | PRN
  Administered 2023-01-17: 2 mL via INTRAVENOUS

## 2023-01-17 NOTE — TOC Transition Note (Signed)
Transition of Care [facility]) - CM/SW Discharge Note   Patient Details  Name: Aaron Rodgers MRN: [id number] Date of Birth: 1961/08/15  [facility] CM/SW Contact:  Lanier Clam, RN Phone Number: 01/17/2023, 12:06 PM   Clinical Narrative:  d/c home No further CM needs.       Barriers to Discharge: No Barriers Identified   Patient Goals and CMS Choice      Discharge Placement                         Discharge Plan and Services Additional resources added to the After Visit Summary for                                       Social Determinants of Health (SDOH) Interventions SDOH Screenings   Food Insecurity: No Food Insecurity (01/15/2023)  Housing: Low Risk  (01/15/2023)  Transportation Needs: Patient Declined (01/15/2023)  Recent Concern: Transportation Needs - Unmet Transportation Needs (01/15/2023)  Utilities: Not At Risk (01/15/2023)  Tobacco Use: High Risk (01/15/2023)     Readmission Risk Interventions     No data to display

## 2023-01-17 NOTE — Progress Notes (Signed)
Patient complaining of headache.  Slightly different than from his routine migraine. Seen at bedside. Given his subarachnoid hemorrhage, will repeat CT head.  If negative he can still go home later today. Currently echo is being done. Discussed with RN  Stephania Fragmin MD
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
## 2023-01-17 NOTE — Progress Notes (Signed)
SATURATION QUALIFICATIONS: (This note is used to comply with regulatory documentation for home oxygen)  Patient Saturations on Room Air at Rest = 94%  Patient Saturations on Room Air while Ambulating = 90%  Please briefly explain why patient needs home oxygen: Pt does not need home O2.
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
## 2023-01-17 NOTE — Plan of Care (Signed)
  Problem: Clinical Measurements: Goal: Ability to maintain clinical measurements within normal limits will improve Outcome: Progressing Goal: Diagnostic test results will improve Outcome: Progressing Goal: Respiratory complications will improve Outcome: Progressing Goal: Cardiovascular complication will be avoided Outcome: Progressing   Problem: Activity: Goal: Risk for activity intolerance will decrease Outcome: Progressing   Problem: Coping: Goal: Level of anxiety will decrease Outcome: Progressing   Problem: Pain Managment: Goal: General experience of comfort will improve Outcome: Progressing

## 2023-01-17 NOTE — Progress Notes (Signed)
Pt does not want to stay for head CT before leaving. Requests to go ahead and leave now.

## 2023-01-29 DIAGNOSIS — R799 Abnormal finding of blood chemistry, unspecified: Secondary | ICD-10-CM | POA: Diagnosis not present

## 2023-01-29 DIAGNOSIS — K5900 Constipation, unspecified: Secondary | ICD-10-CM | POA: Diagnosis not present

## 2023-01-29 DIAGNOSIS — R519 Headache, unspecified: Secondary | ICD-10-CM | POA: Diagnosis not present

## 2023-01-29 DIAGNOSIS — I10 Essential (primary) hypertension: Secondary | ICD-10-CM | POA: Diagnosis not present

## 2023-01-29 DIAGNOSIS — M545 Low back pain, unspecified: Secondary | ICD-10-CM | POA: Diagnosis not present

## 2023-02-12 DIAGNOSIS — R5383 Other fatigue: Secondary | ICD-10-CM | POA: Diagnosis not present

## 2023-02-12 DIAGNOSIS — M545 Low back pain, unspecified: Secondary | ICD-10-CM | POA: Diagnosis not present

## 2023-02-12 DIAGNOSIS — I10 Essential (primary) hypertension: Secondary | ICD-10-CM | POA: Diagnosis not present

## 2023-03-12 DIAGNOSIS — G479 Sleep disorder, unspecified: Secondary | ICD-10-CM | POA: Diagnosis not present

## 2023-03-12 DIAGNOSIS — I10 Essential (primary) hypertension: Secondary | ICD-10-CM | POA: Diagnosis not present

## 2023-03-12 DIAGNOSIS — M545 Low back pain, unspecified: Secondary | ICD-10-CM | POA: Diagnosis not present

## 2024-10-21 ENCOUNTER — Emergency Department (HOSPITAL_COMMUNITY)
Admission: EM | Admit: 2024-10-21 | Discharge: 2024-10-21 | Disposition: A | Discharge status: Home / Self Care | Attending: Emergency Medicine | Admitting: Emergency Medicine

## 2024-10-21 ENCOUNTER — Emergency Department (HOSPITAL_COMMUNITY)

## 2024-10-21 ENCOUNTER — Other Ambulatory Visit: Payer: Self-pay

## 2024-10-21 DIAGNOSIS — K5900 Constipation, unspecified: Secondary | ICD-10-CM | POA: Diagnosis present

## 2024-10-21 DIAGNOSIS — W1839XA Other fall on same level, initial encounter: Secondary | ICD-10-CM | POA: Diagnosis not present

## 2024-10-21 LAB — COMPREHENSIVE METABOLIC PANEL WITH GFR
ALT: 60 U/L — ABNORMAL HIGH (ref 0–44)
AST: 87 U/L — ABNORMAL HIGH (ref 15–41)
Albumin: 3.8 g/dL (ref 3.5–5.0)
Alkaline Phosphatase: 61 U/L (ref 38–126)
Anion gap: 9 (ref 5–15)
BUN: 6 mg/dL — ABNORMAL LOW (ref 8–23)
CO2: 35 mmol/L — ABNORMAL HIGH (ref 22–32)
Calcium: 9.3 mg/dL (ref 8.9–10.3)
Chloride: 94 mmol/L — ABNORMAL LOW (ref 98–111)
Creatinine, Ser: 0.8 mg/dL (ref 0.61–1.24)
GFR, Estimated: >60 mL/min
Glucose, Bld: 117 mg/dL — ABNORMAL HIGH (ref 70–99)
Potassium: 3.2 mmol/L — ABNORMAL LOW (ref 3.5–5.1)
Sodium: 138 mmol/L (ref 135–145)
Total Bilirubin: 1.3 mg/dL — ABNORMAL HIGH (ref 0.0–1.2)
Total Protein: 7.3 g/dL (ref 6.5–8.1)

## 2024-10-21 LAB — CBC WITH DIFFERENTIAL/PLATELET
Abs Immature Granulocytes: 0.02 K/uL (ref 0.00–0.07)
Basophils Absolute: 0 K/uL (ref 0.0–0.1)
Basophils Relative: 1 %
Eosinophils Absolute: 0 K/uL (ref 0.0–0.5)
Eosinophils Relative: 1 %
HCT: 43 % (ref 39.0–52.0)
Hemoglobin: 15.2 g/dL (ref 13.0–17.0)
Immature Granulocytes: 1 %
Lymphocytes Relative: 22 %
Lymphs Abs: 0.9 K/uL (ref 0.7–4.0)
MCH: 33.8 pg (ref 26.0–34.0)
MCHC: 35.3 g/dL (ref 30.0–36.0)
MCV: 95.6 fL (ref 80.0–100.0)
Monocytes Absolute: 0.6 K/uL (ref 0.1–1.0)
Monocytes Relative: 15 %
Neutro Abs: 2.5 K/uL (ref 1.7–7.7)
Neutrophils Relative %: 60 %
Platelets: 204 K/uL (ref 150–400)
RBC: 4.5 MIL/uL (ref 4.22–5.81)
RDW: 13.7 % (ref 11.5–15.5)
Smear Review: NORMAL
WBC: 4 K/uL (ref 4.0–10.5)
nRBC: 0 % (ref 0.0–0.2)

## 2024-10-21 MED ORDER — LACTATED RINGERS IV BOLUS: 1000.0000 mL | Freq: Once | INTRAVENOUS | Status: DC

## 2024-10-21 MED ORDER — SODIUM CHLORIDE 0.9 % IV BOLUS
1000.0000 mL | Freq: Once | INTRAVENOUS | Status: AC
  Administered 2024-10-21: 1000 mL via INTRAVENOUS

## 2024-10-21 MED ORDER — FLEET ENEMA RE ENEM
1.0000 | ENEMA | Freq: Once | RECTAL | Status: AC
  Administered 2024-10-21: 1 via RECTAL
  Filled 2024-10-21: qty 1

## 2024-10-21 MED ORDER — HYDROMORPHONE HCL 1 MG/ML IJ SOLN
0.5000 mg | Freq: Once | INTRAMUSCULAR | Status: AC
  Administered 2024-10-21: 0.5 mg via INTRAVENOUS
  Filled 2024-10-21: qty 1

## 2024-10-21 MED ORDER — IOHEXOL 300 MG/ML  SOLN
100.0000 mL | Freq: Once | INTRAMUSCULAR | Status: AC | PRN
  Administered 2024-10-21: 100 mL via INTRAVENOUS

## 2024-10-21 MED ORDER — POLYETHYLENE GLYCOL 3350 17 GM/SCOOP PO POWD: 17.0000 g | Freq: Two times a day (BID) | ORAL | 0 refills | Status: AC

## 2024-10-21 MED ORDER — MORPHINE SULFATE (PF) 4 MG/ML IV SOLN: 4.0000 mg | Freq: Once | INTRAVENOUS | Status: DC

## 2024-10-21 MED ORDER — SENNOSIDES-DOCUSATE SODIUM 8.6-50 MG PO TABS: 1.0000 | ORAL_TABLET | Freq: Every day | ORAL | 0 refills | Status: AC

## 2024-10-21 MED ORDER — LIDOCAINE HCL URETHRAL/MUCOSAL 2 % EX GEL
1.0000 | Freq: Once | CUTANEOUS | Status: AC
  Administered 2024-10-21: 1 via TOPICAL
  Filled 2024-10-21: qty 11

## 2024-10-21 MED ORDER — POTASSIUM CHLORIDE 20 MEQ PO PACK
40.0000 meq | PACK | Freq: Once | ORAL | Status: AC
  Administered 2024-10-21: 40 meq via ORAL
  Filled 2024-10-21: qty 2

## 2024-10-21 NOTE — ED Provider Notes (Signed)
 " [place] EMERGENCY DEPARTMENT AT [facility] Provider Note   CSN: [id number] Arrival date & time: 10/21/24  1020     Patient presents with: Constipation and Fall   Aaron Rodgers is a 64 y.o. male.   Aaron Rodgers is a 64 y.o. male presenting with constipation x3 weeks.   He reports 3 weeks of constipation starting after a fall. He feels rectal pressure and an urge to defecate but cannot pass stool, though he can pass gas. He feels he is retaining urine and cannot fully empty his bladder. Before this he had a daily morning bowel movement. He has not tried over-the-counter treatments.  The fall occurred about 3 weeks ago, the day after New Year's, and was followed by balance problems and difficulty walking. He describes feeling a little woozy but notes gradual improvement. He walks slowly and avoids putting full weight on his hips. He has had no further falls.  He denies nausea, vomiting, fever, or chills. His appetite is present, but he is eating less to avoid bloating from constipation.  He reports significant stress since his wife died about a year ago.   Constipation Associated symptoms: no fever   Fall       Prior to Admission medications  Medication Sig Start Date End Date Taking? Authorizing Provider  polyethylene glycol powder (MIRALAX ) 17 GM/SCOOP powder Take 17 g by mouth 2 (two) times daily for 7 doses. 10/21/24 10/25/24 Yes Cleotilde Perkins, DO  senna-docusate (SENOKOT-S) 8.6-50 MG tablet Take 1 tablet by mouth daily. 10/21/24  Yes Cleotilde Perkins, DO  albuterol  (PROVENTIL  HFA;VENTOLIN  HFA) 108 (90 BASE) MCG/ACT inhaler Inhale 2 puffs into the lungs every 4 (four) hours as needed for wheezing. Patient not taking: Reported on 10/30/2018 03/29/13   Harris, Abigail, PA-C  amLODipine  (NORVASC ) 5 MG tablet Take 1 tablet (5 mg total) by mouth daily. 01/18/23   Amin, Ankit C, MD  dicyclomine  (BENTYL ) 20 MG tablet Take 1 tablet (20 mg total) by mouth 2 (two) times  daily as needed for up to 15 doses for spasms. Patient not taking: Reported on 01/14/2023 05/16/21   Cottie Donnice PARAS, MD  folic acid  (FOLVITE ) 1 MG tablet Take 1 tablet (1 mg total) by mouth daily. 01/18/23   Amin, Ankit C, MD  Multiple Vitamin (MULTIVITAMIN WITH MINERALS) TABS tablet Take 1 tablet by mouth daily. 01/18/23   Amin, Ankit C, MD  oxyCODONE  (OXY IR/ROXICODONE ) 5 MG immediate release tablet Take 1 tablet (5 mg total) by mouth every 6 (six) hours as needed for severe pain or breakthrough pain. 01/17/23   Amin, Ankit C, MD  predniSONE  (DELTASONE ) 20 MG tablet Take 2 tablets (40 mg total) by mouth daily with breakfast. 01/18/23   Amin, Ankit C, MD  senna-docusate (SENOKOT-S) 8.6-50 MG tablet Take 1 tablet by mouth at bedtime as needed for moderate constipation. 01/17/23   Amin, Ankit C, MD  thiamine  (VITAMIN B-1) 100 MG tablet Take 1 tablet (100 mg total) by mouth daily. 01/18/23   Amin, Ankit C, MD  traZODone  (DESYREL ) 100 MG tablet Take 300 mg by mouth at bedtime. Patient not taking: Reported on 01/14/2023    [provider]    Allergies: Patient has no known allergies.    Review of Systems  Constitutional:  Positive for activity change and appetite change. Negative for fever.  Gastrointestinal:  Positive for constipation.    Updated Vital Signs BP (!) 185/111   Pulse 80   Temp 98.1 F (36.7 C) (  Oral)   Resp 19   SpO2 98%   Physical Exam Constitutional:      General: He is not in acute distress.    Appearance: He is normal weight.  Cardiovascular:     Rate and Rhythm: Normal rate and regular rhythm.     Pulses: Normal pulses.     Heart sounds: No murmur heard. Pulmonary:     Effort: Pulmonary effort is normal. No respiratory distress.     Breath sounds: Normal breath sounds.  Abdominal:     General: Abdomen is flat.     Palpations: Abdomen is soft. There is no mass.     Tenderness: There is abdominal tenderness.  Genitourinary:    Comments: Rectal exam with RN  present Large stool ball palpated  Musculoskeletal:     Comments: Tenderness to palpation over the iliac crests bilaterally Normal gait   Skin:    Capillary Refill: Capillary refill takes less than 2 seconds.  Neurological:     General: No focal deficit present.     Mental Status: He is alert and oriented to person, place, and time. Mental status is at baseline.     (all labs ordered are listed, but only abnormal results are displayed) Labs Reviewed  COMPREHENSIVE METABOLIC PANEL WITH GFR - Abnormal; Notable for the following components:      Result Value   Potassium 3.2 (*)    Chloride 94 (*)    CO2 35 (*)    Glucose, Bld 117 (*)    BUN 6 (*)    AST 87 (*)    ALT 60 (*)    Total Bilirubin 1.3 (*)    All other components within normal limits  CBC WITH DIFFERENTIAL/PLATELET  URINALYSIS, W/ REFLEX TO CULTURE (INFECTION SUSPECTED)    EKG: None  Radiology: CT ABDOMEN PELVIS W CONTRAST Result Date: 10/21/2024 EXAM: CT ABDOMEN AND PELVIS WITH CONTRAST 10/21/2024 09:09:29 PM TECHNIQUE: CT of the abdomen and pelvis was performed with the administration of 100 mL iohexol  (OMNIPAQUE ) 300 MG/ML solution. Multiplanar reformatted images are provided for review. Automated exposure control, iterative reconstruction, and/or weight-based adjustment of the mA/kV was utilized to reduce the radiation dose to as low as reasonably achievable. COMPARISON: 01/14/2023 CLINICAL HISTORY: Abdominal pain, acute, nonlocalized; constipation 3 weeks, hx of L colectomy. FINDINGS: LOWER CHEST: No acute abnormality. LIVER: Fatty infiltration of the liver is noted. Areas of focal enhancement are noted in both the right and left lobes of the liver consistent with hemangiomas. No other focal lesion is seen. GALLBLADDER AND BILE DUCTS: Gallbladder has been surgically removed. No biliary ductal dilatation. SPLEEN: No acute abnormality. PANCREAS: No acute abnormality. ADRENAL GLANDS: No acute abnormality. KIDNEYS, URETERS  AND BLADDER: No stones in the kidneys or ureters. No hydronephrosis. No perinephric or periureteral stranding. Urinary bladder is decompressed. GI AND BOWEL: Stomach demonstrates no acute abnormality. Postsurgical changes are noted in the rectosigmoid. The appendix has been surgically removed. A knuckle of small bowel is noted within a ventral hernia. No evidence of incarceration. There is no bowel obstruction. PERITONEUM AND RETROPERITONEUM: No ascites. No free air. VASCULATURE: Aorta is normal in caliber. LYMPH NODES: No lymphadenopathy. REPRODUCTIVE ORGANS: No acute abnormality. BONES AND SOFT TISSUES: No acute osseous abnormality. IMPRESSION: 1. No acute findings in the abdomen or pelvis. 2. Chronic changes as described above. Electronically signed by: Oneil Devonshire MD 10/21/2024 09:17 PM EST RP Workstation: [id number]   DG Abdomen Acute W/Chest Result Date: 10/21/2024 CLINICAL DATA:  Constipation 3 weeks. EXAM:  DG ABDOMEN ACUTE WITH 1 VIEW CHEST COMPARISON:  Chest x-ray 01/14/2023 and 10/30/2018, abdominal films 03/16/2014 FINDINGS: Lungs are adequately inflated and otherwise clear. Cardiomediastinal silhouette and remainder of the chest is unchanged. Abdominopelvic images demonstrate surgical clips over the gallbladder fossa. Bowel gas pattern is nonobstructive. Mild fecal retention over the right colon and rectum. No free peritoneal air. Single surgical clip over the right pelvis. Possible surgery line over the midline pelvis unchanged. Remainder of the exam is unchanged. IMPRESSION: 1. No acute cardiopulmonary disease. 2. Nonobstructive bowel gas pattern with mild fecal retention over the right colon and rectum. Electronically Signed   By: Toribio Agreste M.D.   On: 10/21/2024 12:05     Procedures   Medications Ordered in the ED  sodium chloride  0.9 % bolus 1,000 mL (1,000 mLs Intravenous New Bag/Given 10/21/24 2044)  sodium phosphate  (FLEET) enema 1 enema (1 enema Rectal Given 10/21/24 1930)   lidocaine  (XYLOCAINE ) 2 % jelly 1 Application (1 Application Topical Given 10/21/24 1930)  HYDROmorphone  (DILAUDID ) injection 0.5 mg (0.5 mg Intravenous Given 10/21/24 2041)  potassium chloride  (KLOR-CON ) packet 40 mEq (40 mEq Oral Given 10/21/24 2041)  iohexol  (OMNIPAQUE ) 300 MG/ML solution 100 mL (100 mLs Intravenous Contrast Given 10/21/24 2109)                                    Medical Decision Making Aaron Rodgers is a 64 y.o. male presenting with 3 weeks of acute onset constipation. He reports having a fall 3 weeks ago which hurt his hips but no other medication changes. He can feel a stool ball in his colon but cannot get it out. On exam he has as palpable stool ball. He is able to stand and ambulate with some discomfort. Patient received 1 L bolus with dilaudid  for pain. He underwent FLEET enema and was able to have a bowel movement. CT AP ordered due to acute onset of constipation with history of left side colectomy in 2009.   CT AP negative for acute abnormality. He is feeling better since having a bowel movement and feels comfortable returning home. Will send a 7 day course of Miralax  and Senna to his pharmacy to continue clean out at home.   Amount and/or Complexity of Data Reviewed Radiology: ordered.  Risk OTC drugs. Prescription drug management.      Final diagnoses:  Acute constipation    ED Discharge Orders          Ordered    polyethylene glycol powder (MIRALAX ) 17 GM/SCOOP powder  2 times daily        10/21/24 2131    senna-docusate (SENOKOT-S) 8.6-50 MG tablet  Daily        10/21/24 2131               Cleotilde Perkins, DO 10/21/24 2131    Patt Alm Macho, MD 10/21/24 2218  "

## 2024-10-21 NOTE — Discharge Instructions (Addendum)
 Continue taking Miralax  twice daily and Senna once daily to continue having bowel movements.

## 2024-10-21 NOTE — ED Triage Notes (Signed)
 Constipation x 3 weeks, last BM doesn't remember.  Fall getting out shower x 2 weeks ago, pt reports he wasn't seen for it.

## 2024-10-21 NOTE — ED Provider Triage Note (Signed)
 Emergency Medicine Provider Triage Evaluation Note  Aaron Rodgers , a 64 y.o. male  was evaluated in triage.  Pt complains of constipation for 3 weeks.  Review of Systems  Positive: Constipation, difficulty urinating Negative: Abdominal pain, fevers  Physical Exam  BP (!) 147/97 (BP Location: Right Arm)   Pulse 82   Temp 98.1 F (36.7 C) (Oral)   Resp 19   SpO2 98%  Gen:   Awake, no distress    Resp:  Normal effort   MSK:   Moves extremities without difficulty   Other:     Medical Decision Making  Medically screening exam initiated at 10:58 AM.  Appropriate orders placed.  Mayola Gillis was informed that the remainder of the evaluation will be completed by another provider, this initial triage assessment does not replace that evaluation, and the importance of remaining in the ED until their evaluation is complete.      Lenor Hollering, MD 10/21/24 1058

## 2024-10-21 NOTE — ED Notes (Signed)
 Pt states that he is unable to urinate due to his constipation. He also states he has been unable to urinate all day.
